# Patient Record
Sex: Female | Born: 1995 | Race: White | Hispanic: No | Marital: Married | Smoking: Former smoker
Health system: Southern US, Community
[De-identification: ages and names within clinical notes are randomized; demographics above are authoritative.]

## PROBLEM LIST (undated history)

## (undated) ENCOUNTER — Inpatient Hospital Stay (HOSPITAL_COMMUNITY): Payer: Self-pay

## (undated) DIAGNOSIS — Z789 Other specified health status: Secondary | ICD-10-CM

## (undated) HISTORY — PX: NO PAST SURGERIES: SHX2092

---

## 2018-07-22 ENCOUNTER — Inpatient Hospital Stay (HOSPITAL_COMMUNITY): Payer: Managed Care, Other (non HMO)

## 2018-07-22 ENCOUNTER — Encounter (HOSPITAL_COMMUNITY): Payer: Self-pay

## 2018-07-22 ENCOUNTER — Inpatient Hospital Stay (HOSPITAL_COMMUNITY)
Admission: AD | Admit: 2018-07-22 | Discharge: 2018-07-22 | Disposition: A | Discharge status: Home / Self Care | Payer: Managed Care, Other (non HMO) | Source: Ambulatory Visit | Attending: Obstetrics and Gynecology | Admitting: Obstetrics and Gynecology

## 2018-07-22 DIAGNOSIS — O23591 Infection of other part of genital tract in pregnancy, first trimester: Secondary | ICD-10-CM | POA: Insufficient documentation

## 2018-07-22 DIAGNOSIS — N760 Acute vaginitis: Secondary | ICD-10-CM | POA: Diagnosis not present

## 2018-07-22 DIAGNOSIS — O209 Hemorrhage in early pregnancy, unspecified: Secondary | ICD-10-CM | POA: Insufficient documentation

## 2018-07-22 DIAGNOSIS — Z87891 Personal history of nicotine dependence: Secondary | ICD-10-CM | POA: Insufficient documentation

## 2018-07-22 DIAGNOSIS — O26899 Other specified pregnancy related conditions, unspecified trimester: Secondary | ICD-10-CM

## 2018-07-22 DIAGNOSIS — R109 Unspecified abdominal pain: Secondary | ICD-10-CM

## 2018-07-22 DIAGNOSIS — Z3A10 10 weeks gestation of pregnancy: Secondary | ICD-10-CM | POA: Diagnosis not present

## 2018-07-22 DIAGNOSIS — R350 Frequency of micturition: Secondary | ICD-10-CM | POA: Diagnosis present

## 2018-07-22 DIAGNOSIS — B9689 Other specified bacterial agents as the cause of diseases classified elsewhere: Secondary | ICD-10-CM | POA: Insufficient documentation

## 2018-07-22 DIAGNOSIS — O26891 Other specified pregnancy related conditions, first trimester: Secondary | ICD-10-CM

## 2018-07-22 HISTORY — DX: Other specified health status: Z78.9

## 2018-07-22 LAB — URINALYSIS, ROUTINE W REFLEX MICROSCOPIC
BILIRUBIN URINE: NEGATIVE
Glucose, UA: NEGATIVE mg/dL
KETONES UR: NEGATIVE mg/dL
Nitrite: NEGATIVE
PROTEIN: 30 mg/dL — AB
SPECIFIC GRAVITY, URINE: 1.018 (ref 1.005–1.030)
pH: 6 (ref 5.0–8.0)

## 2018-07-22 LAB — WET PREP, GENITAL
Sperm: NONE SEEN
Trich, Wet Prep: NONE SEEN
YEAST WET PREP: NONE SEEN

## 2018-07-22 LAB — CBC
HEMATOCRIT: 36.1 % (ref 36.0–46.0)
HEMOGLOBIN: 12.3 g/dL (ref 12.0–15.0)
MCH: 31.3 pg (ref 26.0–34.0)
MCHC: 34.1 g/dL (ref 30.0–36.0)
MCV: 91.9 fL (ref 78.0–100.0)
Platelets: 243 10*3/uL (ref 150–400)
RBC: 3.93 MIL/uL (ref 3.87–5.11)
RDW: 12.6 % (ref 11.5–15.5)
WBC: 11.6 10*3/uL — ABNORMAL HIGH (ref 4.0–10.5)

## 2018-07-22 LAB — HCG, QUANTITATIVE, PREGNANCY: hCG, Beta Chain, Quant, S: 141438 m[IU]/mL — ABNORMAL HIGH (ref <5)

## 2018-07-22 LAB — POCT PREGNANCY, URINE: PREG TEST UR: POSITIVE — AB

## 2018-07-22 MED ORDER — PROMETHAZINE HCL 12.5 MG PO TABS: 12.5000 mg | ORAL_TABLET | Freq: Four times a day (QID) | ORAL | 0 refills | Status: DC | PRN

## 2018-07-22 MED ORDER — METOCLOPRAMIDE HCL 10 MG PO TABS: 10.0000 mg | ORAL_TABLET | Freq: Four times a day (QID) | ORAL | 0 refills | Status: DC

## 2018-07-22 MED ORDER — RANITIDINE HCL 150 MG PO TABS: 150.0000 mg | ORAL_TABLET | Freq: Two times a day (BID) | ORAL | 0 refills | Status: AC

## 2018-07-22 MED ORDER — METRONIDAZOLE 500 MG PO TABS: 500.0000 mg | ORAL_TABLET | Freq: Two times a day (BID) | ORAL | 0 refills | Status: AC

## 2018-07-22 NOTE — MAU Note (Signed)
Pt here with c/o possible UTI, having burn and frequency of urination. Having some vaginal bleeding that started today.

## 2018-07-22 NOTE — Discharge Instructions (Signed)

## 2018-07-22 NOTE — MAU Provider Note (Signed)
History     CSN: [id number]  Arrival date and time: 07/22/18 7829   First Provider Initiated Contact with Patient 07/22/18 2030      Chief Complaint  Patient presents with  . Urinary Tract Infection   HPI  Ms.  Casey Ingram is a 22 y.o. year old G25P0101 female at [redacted]w[redacted]d weeks gestation who presents to MAU reporting possible UTI, burning, and frequency with urination. She also reports some "light pink spotting" that started today. Her last SI was 3-4 days ago. She also complains of nausea; especially with eating. No vomiting. She is requesting a RX for nausea. She has a NOB appt scheduled with [facility] on Friday 07/26/2018. She called their office today and was told to come here for evaluation.  Past Medical History:  Diagnosis Date  . Medical history non-contributory     Past Surgical History:  Procedure Laterality Date  . NO PAST SURGERIES      History reviewed. No pertinent family history.  Social History   Tobacco Use  . Smoking status: Former Smoker    Types: Cigarettes  . Smokeless tobacco: Never Used  Substance Use Topics  . Alcohol use: Not Currently  . Drug use: Never    Allergies: No Known Allergies  No medications prior to admission.    Review of Systems  Constitutional: Negative.   HENT: Negative.   Eyes: Negative.   Respiratory: Negative.   Cardiovascular: Negative.   Gastrointestinal: Positive for abdominal pain and nausea ("especially with eating").  Endocrine: Negative.   Genitourinary: Positive for dysuria, frequency and vaginal bleeding (pink spotting that started today).  Musculoskeletal: Negative.   Skin: Negative.   Allergic/Immunologic: Negative.   Neurological: Negative.   Hematological: Negative.   Psychiatric/Behavioral: Negative.    Physical Exam   Blood pressure 121/67, pulse 69, temperature 98.2 F (36.8 C), temperature source Oral, resp. rate 18, height 5\' 4"  (1.626 m), weight 85.3 kg, last menstrual period 05/17/2018, SpO2 100  %.  Physical Exam  Nursing note and vitals reviewed. Constitutional: She is oriented to person, place, and time. She appears well-developed and well-nourished.  HENT:  Head: Normocephalic and atraumatic.  Eyes: Pupils are equal, round, and reactive to light.  Neck: Normal range of motion.  Cardiovascular: Normal rate and regular rhythm.  Respiratory: Effort normal and breath sounds normal.  GI: Soft. Bowel sounds are normal.  Genitourinary:  Genitourinary Comments: Uterus: enlarged, SE: cervix is smooth, pink, no lesions, small amt of thick, white vaginal d/c; no evidence of spotting or active bleeding at time of exam -- WP, GC/CT done, closed/long/firm, no CMT or friability, no adnexal tenderness    Musculoskeletal: Normal range of motion.  Neurological: She is alert and oriented to person, place, and time.  Skin: Skin is warm and dry.  Psychiatric: She has a normal mood and affect. Her behavior is normal. Judgment and thought content normal.    MAU Course  Procedures  MDM CCUA UPT CBC ABO/Rh HCG Wet Prep GC/CT -- pending HIV -- pending OB < 14 wks [place] with TV  Results for orders placed or performed during the hospital encounter of 07/22/18 (from the past 24 hour(s))  Urinalysis, Routine w reflex microscopic     Status: Abnormal   Collection Time: 07/22/18  8:27 PM  Result Value Ref Range   Color, Urine YELLOW YELLOW   APPearance HAZY (A) CLEAR   Specific Gravity, Urine 1.018 1.005 - 1.030   pH 6.0 5.0 - 8.0   Glucose, UA NEGATIVE  NEGATIVE mg/dL   Hgb urine dipstick LARGE (A) NEGATIVE   Bilirubin Urine NEGATIVE NEGATIVE   Ketones, ur NEGATIVE NEGATIVE mg/dL   Protein, ur 30 (A) NEGATIVE mg/dL   Nitrite NEGATIVE NEGATIVE   Leukocytes, UA SMALL (A) NEGATIVE   RBC / HPF >50 (H) 0 - 5 RBC/hpf   WBC, UA 21-50 0 - 5 WBC/hpf   Bacteria, UA FEW (A) NONE SEEN   Squamous Epithelial / LPF 0-5 0 - 5   Mucus PRESENT   Pregnancy, urine POC     Status: Abnormal   Collection  Time: 07/22/18  8:30 PM  Result Value Ref Range   Preg Test, Ur POSITIVE (A) NEGATIVE  Wet prep, genital     Status: Abnormal   Collection Time: 07/22/18  8:46 PM  Result Value Ref Range   Yeast Wet Prep HPF POC NONE SEEN NONE SEEN   Trich, Wet Prep NONE SEEN NONE SEEN   Clue Cells Wet Prep HPF POC PRESENT (A) NONE SEEN   WBC, Wet Prep HPF POC MODERATE (A) NONE SEEN   Sperm NONE SEEN   CBC     Status: Abnormal   Collection Time: 07/22/18  9:11 PM  Result Value Ref Range   WBC 11.6 (H) 4.0 - 10.5 K/uL   RBC 3.93 3.87 - 5.11 MIL/uL   Hemoglobin 12.3 12.0 - 15.0 g/dL   HCT 09.8 11.9 - 14.7 %   MCV 91.9 78.0 - 100.0 fL   MCH 31.3 26.0 - 34.0 pg   MCHC 34.1 30.0 - 36.0 g/dL   RDW 82.9 56.2 - 13.0 %   Platelets 243 150 - 400 K/uL  ABO/Rh     Status: None (Preliminary result)   Collection Time: 07/22/18  9:11 PM  Result Value Ref Range   ABO/RH(D)      O POS Performed at [facility], [address] [place], [place] [address]   hCG, quantitative, pregnancy     Status: Abnormal   Collection Time: 07/22/18  9:11 PM  Result Value Ref Range   hCG, Beta Chain, Quant, S 141,438 (H) <5 mIU/mL    US Ob Less Than 14 Weeks With Ob Transvaginal  Result Date: 07/22/2018 CLINICAL DATA:  Vaginal bleeding. Positive pregnancy test. Gestational age by last menstrual period 9 weeks and 3 days. EXAM: OBSTETRIC <14 WK [place] AND TRANSVAGINAL OB US TECHNIQUE: Both transabdominal and transvaginal ultrasound examinations were performed for complete evaluation of the gestation as well as the maternal uterus, adnexal regions, and pelvic cul-de-sac. Transvaginal technique was performed to assess early pregnancy. COMPARISON:  None. FINDINGS: Intrauterine gestational sac: Present. Yolk sac:  Present. Embryo:  Present. Cardiac Activity: Present. Heart Rate: 161 bpm CRL:  34 mm   10 w 2 d                  [place] [facility]: April 25th 2020 Subchorionic hemorrhage:  None visualized. Maternal uterus/adnexae: Normal appearance  at adnexa. 19 mm LEFT corpus luteal cyst. No free fluid. IMPRESSION: Single live intrauterine pregnancy, gestational age by ultrasound of 10 weeks and 2 days. No immediate complication. Electronically Signed   By: Awilda Metro M.D.   On: 07/22/2018 22:06    Assessment and Plan  Bacterial vaginitis - Rx for Flagyl 500 mg BID x 7 days - Information provided on BV  Bleeding in early pregnancy  - Advised to call [facility] or return to MAU for bleeding that soaks a pad/hour - Information provided on bleeding in pregnancy  Abdominal pain in pregnancy  - Ok to take Tylenol for pain - Safe meds in pregnancy list given - Information provided on abd pain in pregnancy - Discharge patient - Keep scheduled appt with [facility] on Friday 07/26/2018 - Patient verbalized an understanding of the plan of care and agrees.     Raelyn Mora, MSN, CNM 07/22/2018, 8:30 PM

## 2018-07-23 LAB — ABO/RH: ABO/RH(D): O POS

## 2018-07-23 LAB — GC/CHLAMYDIA PROBE AMP (~~LOC~~) NOT AT ARMC
CHLAMYDIA, DNA PROBE: NEGATIVE
NEISSERIA GONORRHEA: NEGATIVE

## 2018-07-23 LAB — HIV ANTIBODY (ROUTINE TESTING W REFLEX): HIV SCREEN 4TH GENERATION: NONREACTIVE

## 2018-10-23 NOTE — L&D Delivery Note (Signed)
Delivery Note At 3:17 AM a viable female was delivered via Vaginal, Spontaneous (Presentation: ROA).  APGAR: 9, 9; weight pending.  Placenta status: delivered spontaneously and completely.  Cord: 3 vessel with the following complications: nuchal x1, reduced.    Anesthesia:  Epidural  Episiotomy: None Lacerations: 1st degree;Labial Suture Repair: 3.0 vicryl rapide Est. Blood Loss (mL):  Pending   Mom to postpartum.  Baby to Couplet care / Skin to Skin.  Janeece Riggers 02/07/2019, 3:44 AM

## 2019-02-06 ENCOUNTER — Encounter (HOSPITAL_COMMUNITY): Payer: Self-pay

## 2019-02-06 ENCOUNTER — Other Ambulatory Visit: Payer: Self-pay

## 2019-02-06 ENCOUNTER — Inpatient Hospital Stay (HOSPITAL_COMMUNITY)
Admission: AD | Admit: 2019-02-06 | Discharge: 2019-02-08 | DRG: 807 | Disposition: A | Discharge status: Home / Self Care | Payer: Managed Care, Other (non HMO) | Attending: Obstetrics & Gynecology | Admitting: Obstetrics & Gynecology

## 2019-02-06 DIAGNOSIS — Z3A38 38 weeks gestation of pregnancy: Secondary | ICD-10-CM | POA: Diagnosis not present

## 2019-02-06 DIAGNOSIS — B9689 Other specified bacterial agents as the cause of diseases classified elsewhere: Secondary | ICD-10-CM

## 2019-02-06 DIAGNOSIS — Z87891 Personal history of nicotine dependence: Secondary | ICD-10-CM

## 2019-02-06 DIAGNOSIS — O26893 Other specified pregnancy related conditions, third trimester: Secondary | ICD-10-CM | POA: Diagnosis present

## 2019-02-06 DIAGNOSIS — O9902 Anemia complicating childbirth: Secondary | ICD-10-CM | POA: Diagnosis present

## 2019-02-06 DIAGNOSIS — O209 Hemorrhage in early pregnancy, unspecified: Secondary | ICD-10-CM

## 2019-02-06 DIAGNOSIS — D649 Anemia, unspecified: Secondary | ICD-10-CM | POA: Diagnosis present

## 2019-02-06 DIAGNOSIS — N760 Acute vaginitis: Secondary | ICD-10-CM

## 2019-02-06 LAB — CBC
HCT: 32.2 % — ABNORMAL LOW (ref 36.0–46.0)
Hemoglobin: 10.6 g/dL — ABNORMAL LOW (ref 12.0–15.0)
MCH: 29.4 pg (ref 26.0–34.0)
MCHC: 32.9 g/dL (ref 30.0–36.0)
MCV: 89.4 fL (ref 80.0–100.0)
Platelets: 310 10*3/uL (ref 150–400)
RBC: 3.6 MIL/uL — ABNORMAL LOW (ref 3.87–5.11)
RDW: 13.6 % (ref 11.5–15.5)
WBC: 10.9 10*3/uL — ABNORMAL HIGH (ref 4.0–10.5)
nRBC: 0 % (ref 0.0–0.2)

## 2019-02-06 LAB — TYPE AND SCREEN
ABO/RH(D): O POS
Antibody Screen: NEGATIVE

## 2019-02-06 MED ORDER — OXYTOCIN BOLUS FROM INFUSION
500.0000 mL | Freq: Once | INTRAVENOUS | Status: AC
  Administered 2019-02-07: 500 mL via INTRAVENOUS

## 2019-02-06 MED ORDER — LACTATED RINGERS IV SOLN
INTRAVENOUS | Status: DC
  Administered 2019-02-06: via INTRAVENOUS

## 2019-02-06 MED ORDER — ACETAMINOPHEN 325 MG PO TABS: 650.0000 mg | ORAL_TABLET | ORAL | Status: DC | PRN

## 2019-02-06 MED ORDER — OXYCODONE-ACETAMINOPHEN 5-325 MG PO TABS: 2.0000 | ORAL_TABLET | ORAL | Status: DC | PRN

## 2019-02-06 MED ORDER — FENTANYL CITRATE (PF) 100 MCG/2ML IJ SOLN
50.0000 ug | INTRAMUSCULAR | Status: DC | PRN
  Administered 2019-02-07: 50 ug via INTRAVENOUS
  Administered 2019-02-07: 100 ug via INTRAVENOUS
  Filled 2019-02-06 (×2): qty 2

## 2019-02-06 MED ORDER — ONDANSETRON HCL 4 MG/2ML IJ SOLN: 4.0000 mg | Freq: Four times a day (QID) | INTRAMUSCULAR | Status: DC | PRN

## 2019-02-06 MED ORDER — LIDOCAINE HCL (PF) 1 % IJ SOLN
30.0000 mL | INTRAMUSCULAR | Status: AC | PRN
  Administered 2019-02-07: 30 mL via SUBCUTANEOUS
  Filled 2019-02-06: qty 30

## 2019-02-06 MED ORDER — SOD CITRATE-CITRIC ACID 500-334 MG/5ML PO SOLN: 30.0000 mL | ORAL | Status: DC | PRN

## 2019-02-06 MED ORDER — OXYCODONE-ACETAMINOPHEN 5-325 MG PO TABS: 1.0000 | ORAL_TABLET | ORAL | Status: DC | PRN

## 2019-02-06 MED ORDER — LACTATED RINGERS IV SOLN: 500.0000 mL | INTRAVENOUS | Status: DC | PRN

## 2019-02-06 MED ORDER — OXYTOCIN 40 UNITS IN NORMAL SALINE INFUSION - SIMPLE MED
2.5000 [IU]/h | INTRAVENOUS | Status: DC
  Filled 2019-02-06: qty 1000

## 2019-02-06 NOTE — MAU Note (Signed)
Had a gush of fluid at 2015-clear fluid w/ a pink mucous-tinged discharge.  Contracting every 3-6 minutes.  + FM.  Reports no complications w/ the pregnancy.  Was closed on last exam this morning.

## 2019-02-06 NOTE — H&P (Addendum)
Casey Ingram is a 23 y.o. female presenting for SROM in early labor. OB History    Gravida  2   Para  1   Term      Preterm  1   AB      Living  1     SAB      TAB      Ectopic      Multiple      Live Births             Past Medical History:  Diagnosis Date  . Medical history non-contributory    Past Surgical History:  Procedure Laterality Date  . NO PAST SURGERIES     Family History: family history is not on file. Social History:  reports that she has quit smoking. Her smoking use included cigarettes. She has never used smokeless tobacco. She reports previous alcohol use. She reports that she does not use drugs.     Maternal Diabetes: No Genetic Screening: Normal Maternal Ultrasounds/Referrals: Normal Fetal Ultrasounds or other Referrals:  None Maternal Substance Abuse:  No Significant Maternal Medications:  None Significant Maternal Lab Results:  None Other Comments:  None  ROS Maternal Medical History:  Reason for admission: Rupture of membranes and contractions.   Contractions: Onset was less than 1 hour ago.   Frequency: irregular.   Perceived severity is mild.    Fetal activity: Perceived fetal activity is normal.   Last perceived fetal movement was within the past hour.    Prenatal Complications - Diabetes: none.    Dilation: 2 Effacement (%): 70 Station: -2 Exam by:: Latricia Heft, RN   Vitals:   02/06/19 2142 02/06/19 2158 02/06/19 2221  BP:  (!) 134/91 121/67  Pulse:  95 91  Resp:  19   Temp:  98.6 F (37 C)   Weight: 101.7 kg    Height:  5\' 1"  (1.549 m)    Maternal Exam:  Abdomen: Patient reports no abdominal tenderness. Fundal height is Size=dates.   Estimated fetal weight is 7lbs .   Fetal presentation: vertex  Introitus: Normal vulva. Normal vagina.  Vagina is negative for discharge.  Pelvis: adequate for delivery.   Cervix: Cervix evaluated by digital exam.     Physical Exam  Nursing note and vitals  reviewed. Constitutional: She is oriented to person, place, and time. She appears well-developed and well-nourished.  HENT:  Head: Normocephalic and atraumatic.  Eyes: Pupils are equal, round, and reactive to light.  Cardiovascular: Normal rate, regular rhythm and normal heart sounds.  Respiratory: Effort normal and breath sounds normal. No respiratory distress.  GI: There is no abdominal tenderness.  Genitourinary:    Vulva, vagina and uterus normal.     No vaginal discharge.   Musculoskeletal: Normal range of motion.  Neurological: She is alert and oriented to person, place, and time.  Skin: Skin is warm and dry.  Psychiatric: She has a normal mood and affect. Her behavior is normal. Judgment and thought content normal.    Prenatal labs: ABO, Rh: --/--/O POS Performed at [facility], [address] [place], [place] [address]  [phone]) Antibody:  Negative Rubella:  Immune RPR:   NR HBsAg:   NR HIV: Non Reactive (09/30 2111)  GBS:   Negative  Assessment/Plan: 23 y.o. G2P1 at [redacted]w[redacted]d SROM at home in early labor Category 1 FHTs, reactive NST  Admit to L&D Expectant management with augmentation as needed  Ancitipate NSVD   Janeece Riggers 02/06/2019, 11:00 PM

## 2019-02-07 ENCOUNTER — Inpatient Hospital Stay (HOSPITAL_COMMUNITY): Payer: Managed Care, Other (non HMO) | Admitting: Anesthesiology

## 2019-02-07 ENCOUNTER — Encounter (HOSPITAL_COMMUNITY): Payer: Self-pay

## 2019-02-07 LAB — CBC
HCT: 30.3 % — ABNORMAL LOW (ref 36.0–46.0)
Hemoglobin: 9.9 g/dL — ABNORMAL LOW (ref 12.0–15.0)
MCH: 28.9 pg (ref 26.0–34.0)
MCHC: 32.7 g/dL (ref 30.0–36.0)
MCV: 88.6 fL (ref 80.0–100.0)
Platelets: 284 10*3/uL (ref 150–400)
RBC: 3.42 MIL/uL — ABNORMAL LOW (ref 3.87–5.11)
RDW: 13.6 % (ref 11.5–15.5)
WBC: 19.5 10*3/uL — ABNORMAL HIGH (ref 4.0–10.5)
nRBC: 0 % (ref 0.0–0.2)

## 2019-02-07 LAB — RPR: RPR Ser Ql: NONREACTIVE

## 2019-02-07 MED ORDER — EPHEDRINE 5 MG/ML INJ: 10.0000 mg | INTRAVENOUS | Status: DC | PRN

## 2019-02-07 MED ORDER — SIMETHICONE 80 MG PO CHEW: 80.0000 mg | CHEWABLE_TABLET | ORAL | Status: DC | PRN

## 2019-02-07 MED ORDER — PRENATAL MULTIVITAMIN CH
1.0000 | ORAL_TABLET | Freq: Every day | ORAL | Status: DC
  Administered 2019-02-07 – 2019-02-08 (×2): 1 via ORAL
  Filled 2019-02-07 (×2): qty 1

## 2019-02-07 MED ORDER — TETANUS-DIPHTH-ACELL PERTUSSIS 5-2.5-18.5 LF-MCG/0.5 IM SUSP: 0.5000 mL | Freq: Once | INTRAMUSCULAR | Status: DC

## 2019-02-07 MED ORDER — ACETAMINOPHEN 325 MG PO TABS: 650.0000 mg | ORAL_TABLET | ORAL | Status: DC | PRN

## 2019-02-07 MED ORDER — SODIUM CHLORIDE (PF) 0.9 % IJ SOLN
INTRAMUSCULAR | Status: DC | PRN
  Administered 2019-02-07: 12 mL/h via EPIDURAL

## 2019-02-07 MED ORDER — LIDOCAINE HCL (PF) 1 % IJ SOLN
INTRAMUSCULAR | Status: DC | PRN
  Administered 2019-02-07 (×2): 6 mL via EPIDURAL

## 2019-02-07 MED ORDER — PHENYLEPHRINE 40 MCG/ML (10ML) SYRINGE FOR IV PUSH (FOR BLOOD PRESSURE SUPPORT): 80.0000 ug | PREFILLED_SYRINGE | INTRAVENOUS | Status: DC | PRN

## 2019-02-07 MED ORDER — BENZOCAINE-MENTHOL 20-0.5 % EX AERO
1.0000 "application " | INHALATION_SPRAY | CUTANEOUS | Status: DC | PRN
  Administered 2019-02-07: 1 via TOPICAL
  Filled 2019-02-07: qty 56

## 2019-02-07 MED ORDER — IBUPROFEN 600 MG PO TABS
600.0000 mg | ORAL_TABLET | Freq: Four times a day (QID) | ORAL | Status: DC
  Administered 2019-02-07 – 2019-02-08 (×6): 600 mg via ORAL
  Filled 2019-02-07 (×6): qty 1

## 2019-02-07 MED ORDER — ONDANSETRON HCL 4 MG/2ML IJ SOLN: 4.0000 mg | INTRAMUSCULAR | Status: DC | PRN

## 2019-02-07 MED ORDER — FENTANYL-BUPIVACAINE-NACL 0.5-0.125-0.9 MG/250ML-% EP SOLN
12.0000 mL/h | EPIDURAL | Status: DC | PRN
  Filled 2019-02-07: qty 250

## 2019-02-07 MED ORDER — SENNOSIDES-DOCUSATE SODIUM 8.6-50 MG PO TABS
2.0000 | ORAL_TABLET | ORAL | Status: DC
  Administered 2019-02-07: 2 via ORAL
  Filled 2019-02-07: qty 2

## 2019-02-07 MED ORDER — LACTATED RINGERS IV SOLN: 500.0000 mL | Freq: Once | INTRAVENOUS | Status: DC

## 2019-02-07 MED ORDER — DIBUCAINE (PERIANAL) 1 % EX OINT
1.0000 "application " | TOPICAL_OINTMENT | CUTANEOUS | Status: DC | PRN
  Administered 2019-02-07: 1 via RECTAL
  Filled 2019-02-07: qty 28

## 2019-02-07 MED ORDER — DIPHENHYDRAMINE HCL 50 MG/ML IJ SOLN: 12.5000 mg | INTRAMUSCULAR | Status: DC | PRN

## 2019-02-07 MED ORDER — COCONUT OIL OIL
1.0000 "application " | TOPICAL_OIL | Status: DC | PRN
  Administered 2019-02-07: 1 via TOPICAL

## 2019-02-07 MED ORDER — WITCH HAZEL-GLYCERIN EX PADS
1.0000 "application " | MEDICATED_PAD | CUTANEOUS | Status: DC | PRN
  Administered 2019-02-07: 1 via TOPICAL

## 2019-02-07 MED ORDER — ZOLPIDEM TARTRATE 5 MG PO TABS: 5.0000 mg | ORAL_TABLET | Freq: Every evening | ORAL | Status: DC | PRN

## 2019-02-07 MED ORDER — MEDROXYPROGESTERONE ACETATE 150 MG/ML IM SUSP: 150.0000 mg | INTRAMUSCULAR | Status: DC | PRN

## 2019-02-07 MED ORDER — PHENYLEPHRINE 40 MCG/ML (10ML) SYRINGE FOR IV PUSH (FOR BLOOD PRESSURE SUPPORT)
80.0000 ug | PREFILLED_SYRINGE | INTRAVENOUS | Status: DC | PRN
  Filled 2019-02-07: qty 10

## 2019-02-07 MED ORDER — ONDANSETRON HCL 4 MG PO TABS: 4.0000 mg | ORAL_TABLET | ORAL | Status: DC | PRN

## 2019-02-07 MED ORDER — DIPHENHYDRAMINE HCL 25 MG PO CAPS: 25.0000 mg | ORAL_CAPSULE | Freq: Four times a day (QID) | ORAL | Status: DC | PRN

## 2019-02-07 NOTE — Lactation Note (Signed)
This note was copied from a baby's chart. Lactation Consultation Note  Patient Name: Casey Ingram IEPPI'R Date: 02/07/2019 Reason for consult: Initial assessment;Early term 37-38.6wks P2, 19 hour female infant. Infant had one void and 4 stools since delivery. Per mom, she feels breastfeeding is going well, infant is latching without difficulty LC has not observed a latch. Per mom, she last breastfeed at 9:30 pm and infant is asleep in basinet and parents eating dinner. LC ask parents if they want her to come back since they were eating diner they said they were fine to talk.  Mom has order DEBP with insurance company. Per parents, infant has latched six times since delivery for 15 to 30 minutes. Per mom, she did not have this experience with breastfeeding her first child due to infant being in NICU and she pumped only for 5 weeks before stopping due low separation and low milk supply.  Mom knows to breastfeed according to hunger cues , 8 or more times within 24 hours. LC discussed I & O. Reviewed Baby & Me book's Breastfeeding Basics.  Mom knows to call Nurse or LC if she has any questions, concerns or need any assistance with latching infant to breast.  Mom made aware of O/P services, breastfeeding support groups, community resources, and our phone # for post-discharge questions.  Maternal Data Formula Feeding for Exclusion: No Has patient been taught Hand Expression?: Yes Does the patient have breastfeeding experience prior to this delivery?: Yes(Per mom, she mostly pumped with first child for 5 weeks due infant being in NICU.)  Feeding    LATCH Score                   Interventions Interventions: Breast feeding basics reviewed;Hand express;Position options  Lactation Tools Discussed/Used WIC Program: No   Consult Status Consult Status: Follow-up Date: 02/08/19 Follow-up type: In-patient    Danelle Earthly 02/07/2019, 10:51 PM

## 2019-02-07 NOTE — Anesthesia Preprocedure Evaluation (Signed)
Anesthesia Evaluation  Patient identified by MRN, date of birth, ID band Patient awake    Reviewed: Allergy & Precautions, H&P , NPO status , Patient's Chart, lab work & pertinent test results  Airway Mallampati: II  TM Distance: >3 FB Neck ROM: full    Dental no notable dental hx. (+) Teeth Intact   Pulmonary neg pulmonary ROS, former smoker,    Pulmonary exam normal breath sounds clear to auscultation       Cardiovascular negative cardio ROS Normal cardiovascular exam Rhythm:regular Rate:Normal     Neuro/Psych negative neurological ROS  negative psych ROS   GI/Hepatic negative GI ROS, Neg liver ROS,   Endo/Other  Morbid obesity  Renal/GU negative Renal ROS     Musculoskeletal negative musculoskeletal ROS (+)   Abdominal (+) + obese,   Peds  Hematology negative hematology ROS (+)   Anesthesia Other Findings   Reproductive/Obstetrics (+) Pregnancy                             Anesthesia Physical Anesthesia Plan  ASA: III  Anesthesia Plan: Epidural   Post-op Pain Management:    Induction:   PONV Risk Score and Plan:   Airway Management Planned:   Additional Equipment:   Intra-op Plan:   Post-operative Plan:   Informed Consent: I have reviewed the patients History and Physical, chart, labs and discussed the procedure including the risks, benefits and alternatives for the proposed anesthesia with the patient or authorized representative who has indicated his/her understanding and acceptance.       Plan Discussed with:   Anesthesia Plan Comments:         Anesthesia Quick Evaluation

## 2019-02-07 NOTE — Progress Notes (Signed)
Casey Ingram is a 23 y.o. G2P0101 at [redacted]w[redacted]d admitted for rupture of membranes.  Subjective: Patient feeling stronger and more regular contractions.   Objective: Vitals:   02/06/19 2142 02/06/19 2158 02/06/19 2221 02/06/19 2324  BP:  (!) 134/91 121/67 134/89  Pulse:  95 91 83  Resp:  19  20  Temp:  98.6 F (37 C)  97.6 F (36.4 C)  TempSrc:    Oral  Weight: 101.7 kg     Height:  5\' 1"  (1.549 m)     FHT:  FHR: 130s bpm, variability: moderate,  accelerations:  Present,  decelerations:  Present occasional early decels and   UC:   irregular, every 3-6 minutes SVE:   Dilation: 3.5 Effacement (%): 80 Station: -2 Exam by:: CNM, E Greer  Labs: Lab Results  Component Value Date   WBC 10.9 (H) 02/06/2019   HGB 10.6 (L) 02/06/2019   HCT 32.2 (L) 02/06/2019   MCV 89.4 02/06/2019   PLT 310 02/06/2019    Assessment / Plan: Spontaneous labor, progressing normally  Labor: Progressing normally Preeclampsia:  no signs or symptoms of toxicity, intake and ouput balanced and labs stable Fetal Wellbeing:  Category I Pain Control:  IV pain meds I/D:  n/a Anticipated MOD:  NSVD  Casey Ingram 02/07/2019, 1:01 AM

## 2019-02-07 NOTE — Anesthesia Postprocedure Evaluation (Signed)
Anesthesia Post Note  Patient: Casey Ingram  Procedure(s) Performed: AN AD HOC LABOR EPIDURAL     Patient location during evaluation: Mother Baby Anesthesia Type: Epidural Level of consciousness: awake and alert Pain management: pain level controlled Vital Signs Assessment: post-procedure vital signs reviewed and stable Respiratory status: spontaneous breathing, nonlabored ventilation and respiratory function stable Cardiovascular status: stable Postop Assessment: no headache, no backache and epidural receding Anesthetic complications: no Comments: Per telephone conversation    Last Vitals:  Vitals:   02/07/19 1030 02/07/19 1424  BP: 124/83 130/83  Pulse: 67 72  Resp: 18 18  Temp: 36.7 C 37 C  SpO2:  99%    Last Pain:  Vitals:   02/07/19 1424  TempSrc: Oral  PainSc:    Pain Goal:                   Trellis Paganini

## 2019-02-07 NOTE — Plan of Care (Signed)
  Problem: Education: Goal: Knowledge of Childbirth will improve Outcome: Progressing Goal: Ability to make informed decisions regarding treatment and plan of care will improve Outcome: Progressing Goal: Ability to state and carry out methods to decrease the pain will improve Outcome: Progressing   Problem: Pain Management: Goal: Relief or control of pain from uterine contractions will improve Outcome: Progressing

## 2019-02-07 NOTE — Anesthesia Procedure Notes (Signed)
Epidural Patient location during procedure: OB Start time: 02/07/2019 2:48 AM End time: 02/07/2019 2:51 AM  Staffing Anesthesiologist: Leilani Able, MD Performed: anesthesiologist   Preanesthetic Checklist Completed: patient identified, site marked, surgical consent, pre-op evaluation, timeout performed, IV checked, risks and benefits discussed and monitors and equipment checked  Epidural Patient position: sitting Prep: site prepped and draped and DuraPrep Patient monitoring: continuous pulse ox and blood pressure Approach: midline Location: L3-L4 Injection technique: LOR air  Needle:  Needle type: Tuohy  Needle gauge: 17 G Needle length: 9 cm and 9 Needle insertion depth: 7 cm Catheter type: closed end flexible Catheter size: 19 Gauge Catheter at skin depth: 12 cm Test dose: negative and Other  Assessment Sensory level: T9 Events: blood not aspirated, injection not painful, no injection resistance, negative IV test and no paresthesia  Additional Notes Reason for block:procedure for pain

## 2019-02-08 LAB — ABO/RH: ABO/RH(D): O POS

## 2019-02-08 MED ORDER — IBUPROFEN 600 MG PO TABS: 600.0000 mg | ORAL_TABLET | Freq: Four times a day (QID) | ORAL | 0 refills | Status: AC

## 2019-02-08 NOTE — Discharge Instructions (Signed)
Postpartum Care After Vaginal Delivery ° °The period of time right after you deliver your newborn is called the postpartum period. °What kind of medical care will I receive? °· You may continue to receive fluids and medicines through an IV tube inserted into one of your veins. °· If an incision was made near your vagina (episiotomy) or if you had some vaginal tearing during delivery, cold compresses may be placed on your episiotomy or your tear. This helps to reduce pain and swelling. °· You may be given a squirt bottle to use when you go to the bathroom. You may use this until you are comfortable wiping as usual. To use the squirt bottle, follow these steps: °? Before you urinate, fill the squirt bottle with warm water. Do not use hot water. °? After you urinate, while you are sitting on the toilet, use the squirt bottle to rinse the area around your urethra and vaginal opening. This rinses away any urine and blood. °? You may do this instead of wiping. As you start healing, you may use the squirt bottle before wiping yourself. Make sure to wipe gently. °? Fill the squirt bottle with clean water every time you use the bathroom. °· You will be given sanitary pads to wear. °How can I expect to feel? °· You may not feel the need to urinate for several hours after delivery. °· You will have some soreness and pain in your abdomen and vagina. °· If you are breastfeeding, you may have uterine contractions every time you breastfeed for up to several weeks postpartum. Uterine contractions help your uterus return to its normal size. °· It is normal to have vaginal bleeding (lochia) after delivery. The amount and appearance of lochia is often similar to a menstrual period in the first week after delivery. It will gradually decrease over the next few weeks to a dry, yellow-brown discharge. For most women, lochia stops completely by 6-8 weeks after delivery. Vaginal bleeding can vary from woman to woman. °· Within the first few  days after delivery, you may have breast engorgement. This is when your breasts feel heavy, full, and uncomfortable. Your breasts may also throb and feel hard, tightly stretched, warm, and tender. After this occurs, you may have milk leaking from your breasts. Your health care provider can help you relieve discomfort due to breast engorgement. Breast engorgement should go away within a few days. °· You may feel more sad or worried than normal due to hormonal changes after delivery. These feelings should not last more than a few days. If these feelings do not go away after several days, speak with your health care provider. °How should I care for myself? °· Tell your health care provider if you have pain or discomfort. °· Drink enough water to keep your urine clear or pale yellow. °· Wash your hands thoroughly with soap and water for at least 20 seconds after changing your sanitary pads, after using the toilet, and before holding or feeding your baby. °· If you are not breastfeeding, avoid touching your breasts a lot. Doing this can make your breasts produce more milk. °· If you become weak or lightheaded, or you feel like you might faint, ask for help before: °? Getting out of bed. °? Showering. °· Change your sanitary pads frequently. Watch for any changes in your flow, such as a sudden increase in volume, a change in color, the passing of large blood clots. If you pass a blood clot from your vagina,   save it to show to your health care provider. Do not flush blood clots down the toilet without having your health care provider look at them.  Make sure that all your vaccinations are up to date. This can help protect you and your baby from getting certain diseases. You may need to have immunizations done before you leave the hospital.  If desired, talk with your health care provider about methods of family planning or birth control (contraception). How can I start bonding with my baby? Spending as much time as  possible with your baby is very important. During this time, you and your baby can get to know each other and develop a bond. Having your baby stay with you in your room (rooming in) can give you time to get to know your baby. Rooming in can also help you become comfortable caring for your baby. Breastfeeding can also help you bond with your baby. How can I plan for returning home with my baby?  Make sure that you have a car seat installed in your vehicle. ? Your car seat should be checked by a certified car seat installer to make sure that it is installed safely. ? Make sure that your baby fits into the car seat safely.  Ask your health care provider any questions you have about caring for yourself or your baby. Make sure that you are able to contact your health care provider with any questions after leaving the hospital. This information is not intended to replace advice given to you by your health care provider. Make sure you discuss any questions you have with your health care provider. Document Released: 08/06/2007 Document Revised: 03/13/2016 Document Reviewed: 09/13/2015 Elsevier Interactive Patient Education  2018 [employer].   Postpartum Depression and Baby Blues The postpartum period begins right after the birth of a baby. During this time, there is often a great amount of joy and excitement. It is also a time of many changes in the life of the parents. Regardless of how many times a mother gives birth, each child brings new challenges and dynamics to the family. It is not unusual to have feelings of excitement along with confusing shifts in moods, emotions, and thoughts. All mothers are at risk of developing postpartum depression or the "baby blues." These mood changes can occur right after giving birth, or they may occur many months after giving birth. The baby blues or postpartum depression can be mild or severe. Additionally, postpartum depression can go away rather quickly, or it can  be a long-term condition. What are the causes? Raised hormone levels and the rapid drop in those levels are thought to be a main cause of postpartum depression and the baby blues. A number of hormones change during and after pregnancy. Estrogen and progesterone usually decrease right after the delivery of your baby. The levels of thyroid hormone and various cortisol steroids also rapidly drop. Other factors that play a role in these mood changes include major life events and genetics. What increases the risk? If you have any of the following risks for the baby blues or postpartum depression, know what symptoms to watch out for during the postpartum period. Risk factors that may increase the likelihood of getting the baby blues or postpartum depression include:  Having a personal or family history of depression.  Having depression while being pregnant.  Having premenstrual mood issues or mood issues related to oral contraceptives.  Having a lot of life stress.  Having marital conflict.  Lacking  a social support network.  Having a baby with special needs.  Having health problems, such as diabetes.  What are the signs or symptoms? Symptoms of baby blues include:  Brief changes in mood, such as going from extreme happiness to sadness.  Decreased concentration.  Difficulty sleeping.  Crying spells, tearfulness.  Irritability.  Anxiety.  Symptoms of postpartum depression typically begin within the first month after giving birth. These symptoms include:  Difficulty sleeping or excessive sleepiness.  Marked weight loss.  Agitation.  Feelings of worthlessness.  Lack of interest in activity or food.  Postpartum psychosis is a very serious condition and can be dangerous. Fortunately, it is rare. Displaying any of the following symptoms is cause for immediate medical attention. Symptoms of postpartum psychosis include:  Hallucinations and delusions.  Bizarre or disorganized  behavior.  Confusion or disorientation.  How is this diagnosed? A diagnosis is made by an evaluation of your symptoms. There are no medical or lab tests that lead to a diagnosis, but there are various questionnaires that a health care provider may use to identify those with the baby blues, postpartum depression, or psychosis. Often, a screening tool called the [place] Postnatal Depression Scale is used to diagnose depression in the postpartum period. How is this treated? The baby blues usually goes away on its own in 1-2 weeks. Social support is often all that is needed. You will be encouraged to get adequate sleep and rest. Occasionally, you may be given medicines to help you sleep. Postpartum depression requires treatment because it can last several months or longer if it is not treated. Treatment may include individual or group therapy, medicine, or both to address any social, physiological, and psychological factors that may play a role in the depression. Regular exercise, a healthy diet, rest, and social support may also be strongly recommended. Postpartum psychosis is more serious and needs treatment right away. Hospitalization is often needed. Follow these instructions at home:  Get as much rest as you can. Nap when the baby sleeps.  Exercise regularly. Some women find yoga and walking to be beneficial.  Eat a balanced and nourishing diet.  Do little things that you enjoy. Have a cup of tea, take a bubble bath, read your favorite magazine, or listen to your favorite music.  Avoid alcohol.  Ask for help with household chores, cooking, grocery shopping, or running errands as needed. Do not try to do everything.  Talk to people close to you about how you are feeling. Get support from your partner, family members, friends, or other new moms.  Try to stay positive in how you think. Think about the things you are grateful for.  Do not spend a lot of time alone.  Only take  over-the-counter or prescription medicine as directed by your health care provider.  Keep all your postpartum appointments.  Let your health care provider know if you have any concerns. Contact a health care provider if: You are having a reaction to or problems with your medicine. Get help right away if:  You have suicidal feelings.  You think you may harm the baby or someone else. This information is not intended to replace advice given to you by your health care provider. Make sure you discuss any questions you have with your health care provider. Document Released: 07/13/2004 Document Revised: 03/16/2016 Document Reviewed: 07/21/2013 Elsevier Interactive Patient Education  2017 [employer]   Iron-Rich Diet  Iron is a mineral that helps your body to produce hemoglobin. Hemoglobin  is a protein in red blood cells that carries oxygen to your body's tissues. Eating too little iron may cause you to feel weak and tired, and it can increase your risk of infection. Iron is naturally found in many foods, and many foods have iron added to them (iron-fortified foods). You may need to follow an iron-rich diet if you do not have enough iron in your body due to certain medical conditions. The amount of iron that you need each day depends on your age, your sex, and any medical conditions you have. Follow instructions from your health care provider or a diet and nutrition specialist (dietitian) about how much iron you should eat each day. What are tips for following this plan? Reading food labels  Check food labels to see how many milligrams (mg) of iron are in each serving. Cooking  Cook foods in pots and pans that are made from iron.  Take these steps to make it easier for your body to absorb iron from certain foods: ? Soak beans overnight before cooking. ? Soak whole grains overnight and drain them before using. ? Ferment flours before baking, such as by using yeast in bread dough. Meal  planning  When you eat foods that contain iron, you should eat them with foods that are high in vitamin C. These include oranges, peppers, tomatoes, potatoes, and mango. Vitamin C helps your body to absorb iron. General information  Take iron supplements only as told by your health care provider. An overdose of iron can be life-threatening. If you were prescribed iron supplements, take them with orange juice or a vitamin C supplement.  When you eat iron-fortified foods or take an iron supplement, you should also eat foods that naturally contain iron, such as meat, poultry, and fish. Eating naturally iron-rich foods helps your body to absorb the iron that is added to other foods or contained in a supplement.  Certain foods and drinks prevent your body from absorbing iron properly. Avoid eating these foods in the same meal as iron-rich foods or with iron supplements. These foods include: ? Coffee, black tea, and red wine. ? Milk, dairy products, and foods that are high in calcium. ? Beans and soybeans. ? Whole grains. What foods should I eat? Fruits Prunes. Raisins. Eat fruits high in vitamin C, such as oranges, grapefruits, and strawberries, alongside iron-rich foods. Vegetables Spinach (cooked). Green peas. Broccoli. Fermented vegetables. Eat vegetables high in vitamin C, such as leafy greens, potatoes, bell peppers, and tomatoes, alongside iron-rich foods. Grains Iron-fortified breakfast cereal. Iron-fortified whole-wheat bread. Enriched rice. Sprouted grains. Meats and other proteins Beef liver. Oysters. Beef. Shrimp. [place]. Chicken. Tuna. Sardines. Chickpeas. Nuts. Tofu. Pumpkin seeds. Beverages Tomato juice. Fresh orange juice. Prune juice. Hibiscus tea. Fortified instant breakfast shakes. Sweets and desserts Blackstrap molasses. Seasonings and condiments Tahini. Fermented soy sauce. Other foods Wheat germ. The items listed above may not be a complete list of recommended foods  and beverages. Contact a dietitian for more information. What foods should I avoid? Grains Whole grains. Bran cereal. Bran flour. Oats. Meats and other proteins Soybeans. Products made from soy protein. Black beans. Lentils. Mung beans. Split peas. Dairy Milk. Cream. Cheese. Yogurt. Cottage cheese. Beverages Coffee. Black tea. Red wine. Sweets and desserts Cocoa. Chocolate. Ice cream. Other foods Basil. Oregano. Large amounts of parsley. The items listed above may not be a complete list of foods and beverages to avoid. Contact a dietitian for more information. Summary  Iron is a mineral that helps  your body to produce hemoglobin. Hemoglobin is a protein in red blood cells that carries oxygen to your body's tissues.  Iron is naturally found in many foods, and many foods have iron added to them (iron-fortified foods).  When you eat foods that contain iron, you should eat them with foods that are high in vitamin C. Vitamin C helps your body to absorb iron.  Certain foods and drinks prevent your body from absorbing iron properly, such as whole grains and dairy products. You should avoid eating these foods in the same meal as iron-rich foods or with iron supplements. This information is not intended to replace advice given to you by your health care provider. Make sure you discuss any questions you have with your health care provider. Document Released: 05/23/2005 Document Revised: 09/04/2017 Document Reviewed: 09/04/2017 Elsevier Interactive Patient Education  2019 [employer]

## 2019-02-08 NOTE — Discharge Summary (Signed)
OB Discharge Summary     Patient Name: Casey Ingram DOB: 1996/03/11 MRN: [id number]  Date of admission: 02/06/2019 Delivering MD: Janeece RiggersGREER, Drevion Offord K   Date of discharge: 02/08/2019  Admitting diagnosis: water broke  Intrauterine pregnancy: [id number]     Secondary diagnosis:  Active Problems:   Normal labor      Discharge diagnosis: Term Pregnancy Delivered and Anemia                                                                                                Post partum procedures:n/a  Augmentation: n/a  Complications: None  Hospital course:  Onset of Labor With Vaginal Delivery     23 y.o. yo [id number] at 7249w6d was admitted in Latent Labor on 02/06/2019. Patient had an uncomplicated labor course as follows:  Membrane Rupture Time/Date: 8:15 PM ,02/06/2019   Intrapartum Procedures: Episiotomy: None [1]                                         Lacerations:  1st degree [2];Labial [10]  Patient had a delivery of a Viable infant. 02/07/2019  Information for the patient's newborn:  Bruna Pottereague, Girl Morrie Sheldonshley [[id number]]  Delivery Method: Vag-Spont    Pateint had an uncomplicated postpartum course.  She is ambulating, tolerating a regular diet, passing flatus, and urinating well. Patient is discharged home in stable condition on 02/08/19.   Physical exam  Vitals:   02/07/19 1424 02/07/19 1942 02/07/19 2233 02/08/19 0515  BP: 130/83 121/77 131/83 113/76  Pulse: 72 78 79 75  Resp: 18  18   Temp: 98.6 F (37 C) 99.6 F (37.6 C) 98.4 F (36.9 C) 97.8 F (36.6 C)  TempSrc: Oral Oral Oral Oral  SpO2: 99%   98%  Weight:      Height:       General: alert, cooperative and no distress Lochia: appropriate Uterine Fundus: firm Incision: N/A DVT Evaluation: No evidence of DVT seen on physical exam. Negative Homan's sign. No cords or calf tenderness. No significant calf/ankle edema.  Labs: Lab Results  Component Value Date   WBC 19.5 (H) 02/07/2019   HGB 9.9 (L) 02/07/2019   HCT 30.3  (L) 02/07/2019   MCV 88.6 02/07/2019   PLT 284 02/07/2019   No flowsheet data found.  Discharge instruction: per After Visit Summary and "Baby and Me Booklet".  After visit meds:  Allergies as of 02/08/2019   No Known Allergies     Medication List    STOP taking these medications   metoCLOPramide 10 MG tablet Commonly known as:  REGLAN   promethazine 12.5 MG tablet Commonly known as:  PHENERGAN     TAKE these medications   acetaminophen 500 MG tablet Commonly known as:  TYLENOL Take 1,000 mg by mouth every 6 (six) hours as needed for mild pain.   calcium carbonate 500 MG chewable tablet Commonly known as:  TUMS - dosed in mg elemental calcium Chew 2 tablets by mouth daily.   ibuprofen 600  MG tablet Commonly known as:  ADVIL Take 1 tablet (600 mg total) by mouth every 6 (six) hours.   loratadine 10 MG tablet Commonly known as:  CLARITIN Take 10 mg by mouth daily.   prenatal multivitamin Tabs tablet Take 1 tablet by mouth daily at 12 noon.   ranitidine 150 MG tablet Commonly known as:  ZANTAC Take 1 tablet (150 mg total) by mouth 2 (two) times daily.       Diet: routine diet, iron rich diet for mild asymptomatic anemia  Activity: Advance as tolerated. Pelvic rest for 6 weeks.   Outpatient follow up:6 weeks Follow up Appt:No future appointments. Follow up Visit:No follow-ups on file.  Postpartum contraception: Undecided  Newborn Data: Live born female  Birth Weight: 6 lb 12.8 oz (3084 g) APGAR: 9, 9  Newborn Delivery   Birth date/time:  02/07/2019 03:17:00 Delivery type:  Vaginal, Spontaneous     Baby Feeding: Breast Disposition:home with mother   02/08/2019 Janeece Riggers, CNM

## 2019-09-05 IMAGING — US US OB < 14 WEEKS - US OB TV
1 series · 15 of 28 positions shown · non-contrast
Comparison: None.

CLINICAL DATA: Vaginal bleeding. Positive pregnancy test.
Gestational age by last menstrual period 9 weeks and 3 days.

EXAM:
OBSTETRIC <14 WK US AND TRANSVAGINAL OB US
TECHNIQUE: Both transabdominal and transvaginal ultrasound examinations were
performed for complete evaluation of the gestation as well as the
maternal uterus, adnexal regions, and pelvic cul-de-sac.
Transvaginal technique was performed to assess early pregnancy.

[Series 1: us ob < 14 weeks - us ob tv · 15 of 58 slices shown]
[im 1/58]
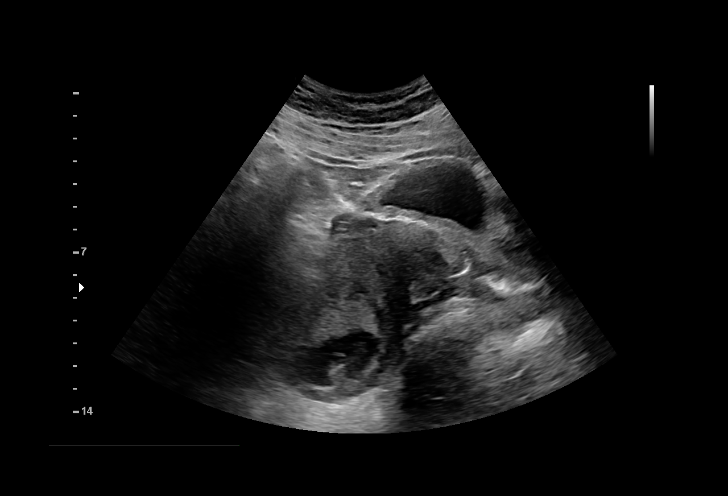
[im 5/58]
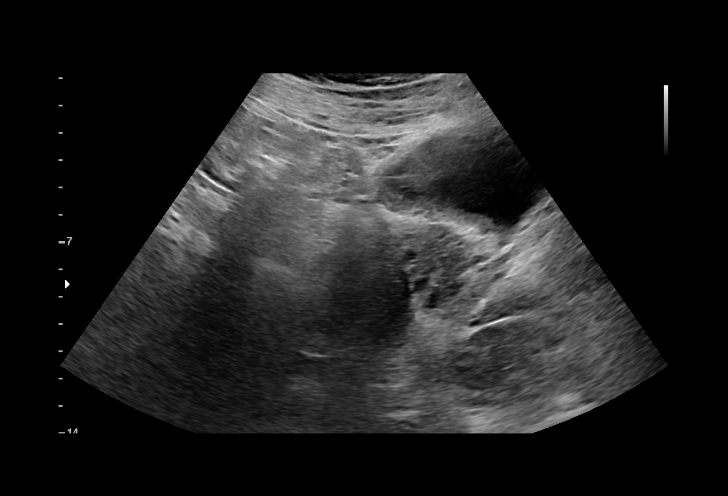
[im 9/58]
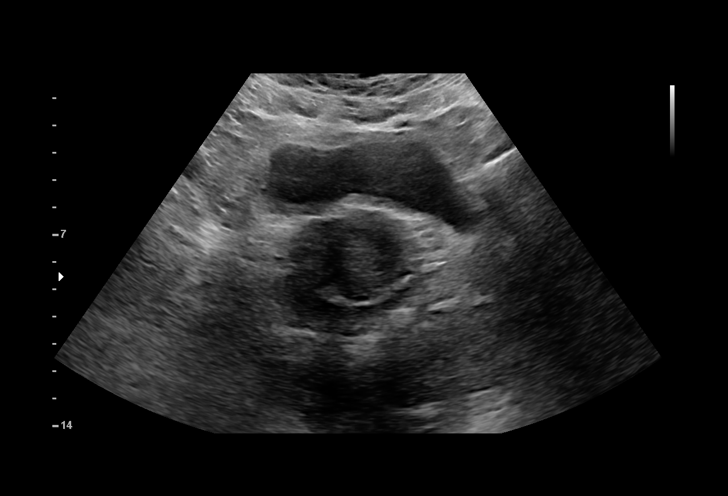
[im 13/58]
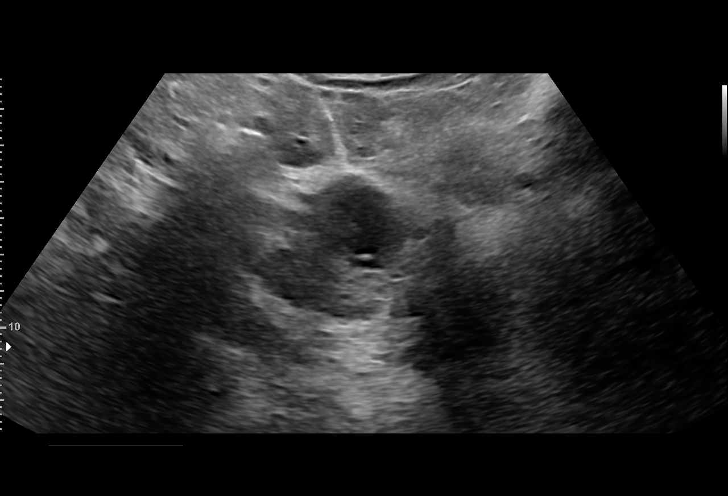
[im 17/58]
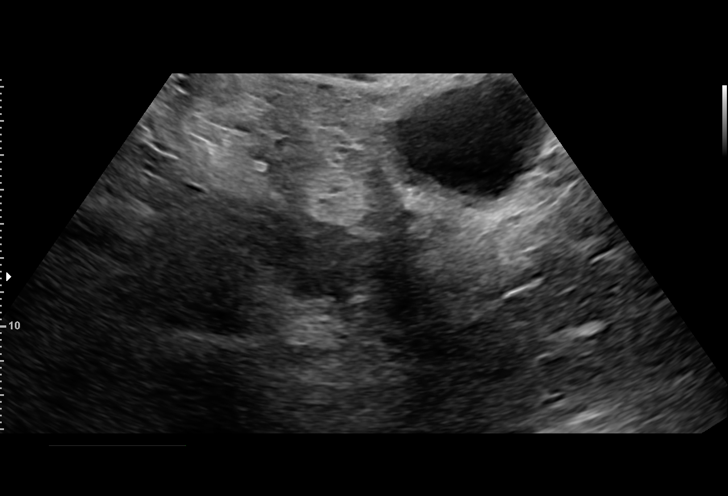
[im 22/58]
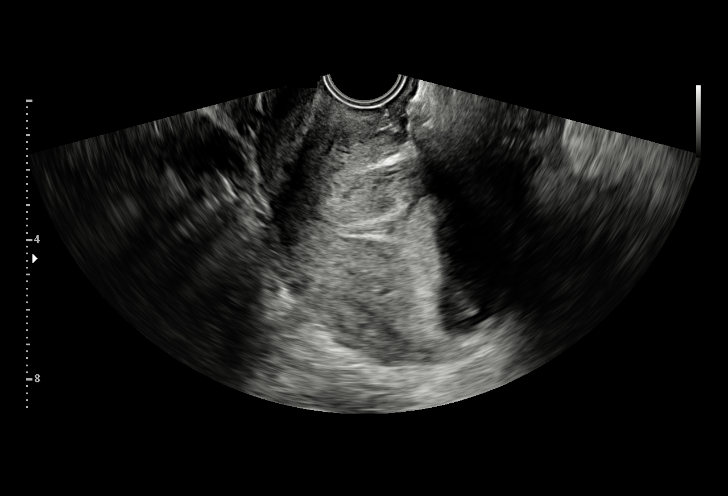
[im 26/58]
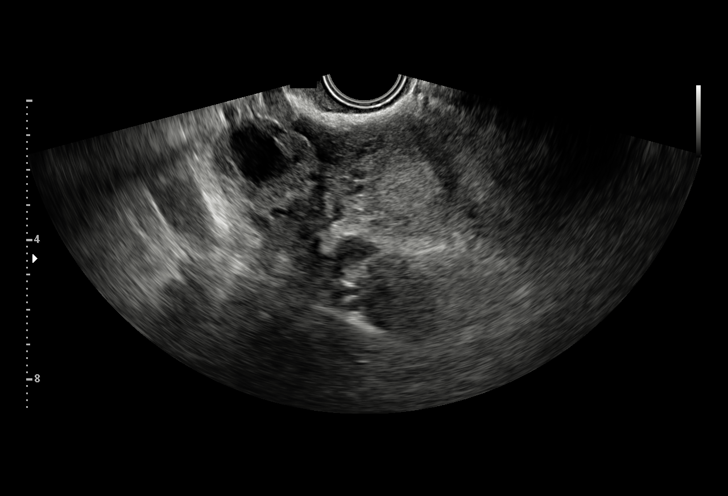
[im 30/58]
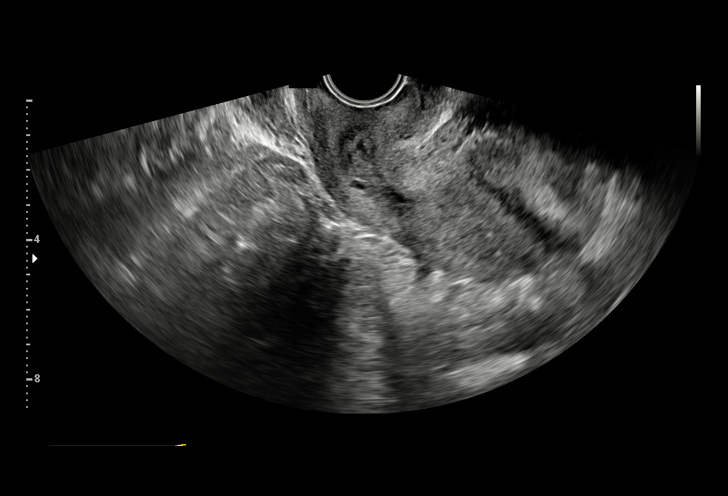
[im 32/58]
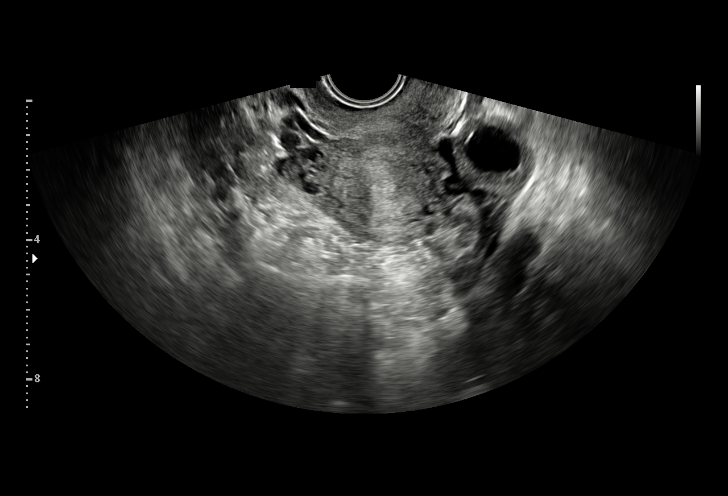
[im 36/58]
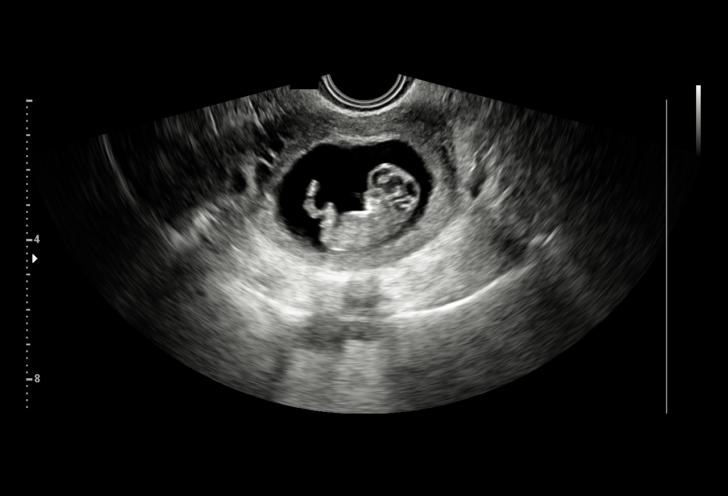
[im 41/58]
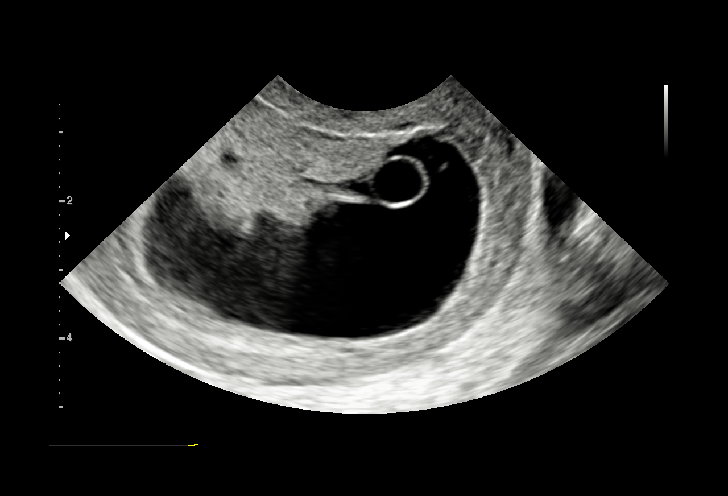
[im 45/58]
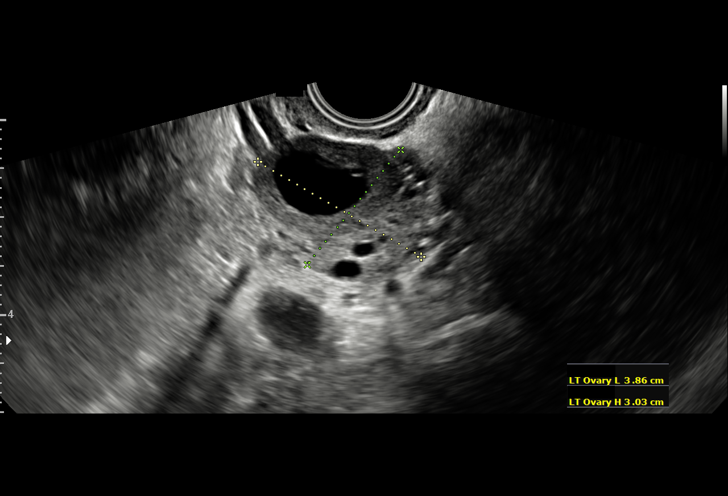
[im 49/58]
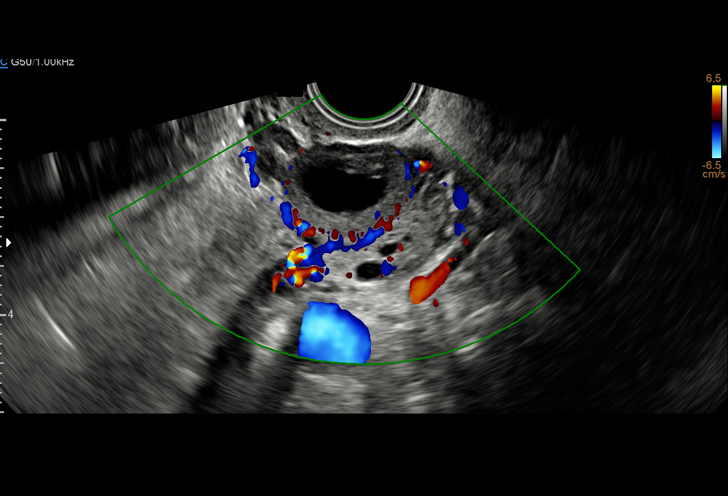
[im 53/58]
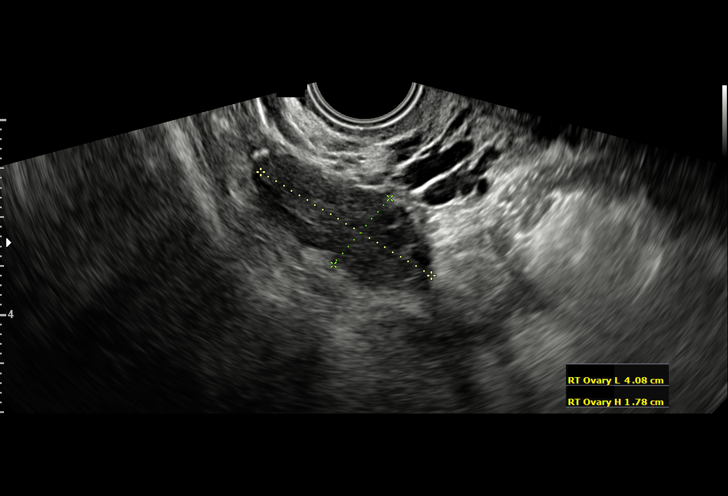
[im 58/58]
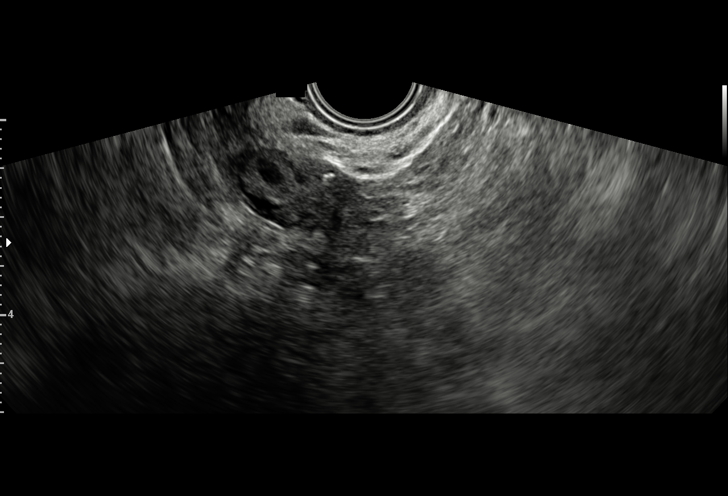

[15 of 28 positions shown; findings below may reference images not displayed]

FINDINGS: Intrauterine gestational sac: Present.

Yolk sac:  Present.

Embryo:  Present.

Cardiac Activity: Present.

Heart Rate: 161 bpm

CRL:  34 mm   10 w 2 d                  US EDC: Friday February, 2019

Subchorionic hemorrhage:  None visualized.

Maternal uterus/adnexae: Normal appearance at adnexa. 19 mm LEFT
corpus luteal cyst. No free fluid.
IMPRESSION: Single live intrauterine pregnancy, gestational age by ultrasound of
10 weeks and 2 days. No immediate complication.

## 2020-07-13 ENCOUNTER — Other Ambulatory Visit: Payer: Self-pay

## 2020-07-13 ENCOUNTER — Ambulatory Visit (HOSPITAL_COMMUNITY)
Admission: EM | Admit: 2020-07-13 | Discharge: 2020-07-13 | Disposition: A | Discharge status: Home / Self Care | Payer: Managed Care, Other (non HMO) | Attending: Family Medicine | Admitting: Family Medicine

## 2020-07-13 ENCOUNTER — Encounter (HOSPITAL_COMMUNITY): Payer: Self-pay | Admitting: Emergency Medicine

## 2020-07-13 DIAGNOSIS — Z3202 Encounter for pregnancy test, result negative: Secondary | ICD-10-CM | POA: Diagnosis not present

## 2020-07-13 DIAGNOSIS — R102 Pelvic and perineal pain: Secondary | ICD-10-CM | POA: Insufficient documentation

## 2020-07-13 DIAGNOSIS — N898 Other specified noninflammatory disorders of vagina: Secondary | ICD-10-CM | POA: Diagnosis not present

## 2020-07-13 LAB — POC URINE PREG, ED: Preg Test, Ur: NEGATIVE

## 2020-07-13 LAB — POCT URINALYSIS DIPSTICK, ED / UC
Bilirubin Urine: NEGATIVE
Glucose, UA: NEGATIVE mg/dL
Hgb urine dipstick: NEGATIVE
Ketones, ur: NEGATIVE mg/dL
Nitrite: NEGATIVE
Protein, ur: NEGATIVE mg/dL
Specific Gravity, Urine: 1.02 (ref 1.005–1.030)
Urobilinogen, UA: 0.2 mg/dL (ref 0.0–1.0)
pH: 7 (ref 5.0–8.0)

## 2020-07-13 MED ORDER — METRONIDAZOLE 500 MG PO TABS: 500.0000 mg | ORAL_TABLET | Freq: Two times a day (BID) | ORAL | 0 refills | Status: AC

## 2020-07-13 MED ORDER — DOXYCYCLINE HYCLATE 100 MG PO CAPS: 100.0000 mg | ORAL_CAPSULE | Freq: Two times a day (BID) | ORAL | 0 refills | Status: AC

## 2020-07-13 MED ORDER — FLUCONAZOLE 150 MG PO TABS: 150.0000 mg | ORAL_TABLET | Freq: Every day | ORAL | 0 refills | Status: AC

## 2020-07-13 NOTE — ED Triage Notes (Signed)
Pt c/o pelvic pain and vaginal discharge x 1 month. Pt states she is urinating frequently and there is an odor. She states she has been drinking a lot of water and cranberry juice and taking AZO tablets.

## 2020-07-13 NOTE — Discharge Instructions (Addendum)
Treating you for bacteria vaginosis and pelvic inflammatory infection. Take the antibiotics as prescribed.  I will also prescribe some fluconazole in case you get a yeast infection from taking antibiotics.  You can have this for standby. Follow-up with your OB/GYN for any continued issues You can check your MyChart for results or we will call you with any positive results

## 2020-07-13 NOTE — ED Provider Notes (Signed)
[facility]    CSN: [id number] Arrival date & time: 07/13/20  3716      History   Chief Complaint Chief Complaint  Patient presents with  . Pelvic Pain  . Vaginal Discharge    HPI Aleiya Rye is a 24 y.o. female.   Patient is a 24 year old female who presents today with approximate 1 month of vaginal discharge, odor.  Symptoms been constant worsening.  She is now having some lower pelvic discomfort.  No fevers, chills or body aches.  She has been taking AZO and drinking cranberry juice.  No dysuria, hematuria or urinary frequency.  Currently sexual active with her husband.  Has IUD for birth control.     Past Medical History:  Diagnosis Date  . Medical history non-contributory     Patient Active Problem List   Diagnosis Date Noted  . Normal labor 02/06/2019  . Bacterial vaginitis 07/22/2018  . Bleeding in early pregnancy 07/22/2018    Past Surgical History:  Procedure Laterality Date  . NO PAST SURGERIES      OB History    Gravida  2   Para  2   Term  1   Preterm  1   AB      Living  2     SAB      TAB      Ectopic      Multiple  0   Live Births  1            Home Medications    Prior to Admission medications   Medication Sig Start Date End Date Taking? Authorizing Provider  acetaminophen (TYLENOL) 500 MG tablet Take 1,000 mg by mouth every 6 (six) hours as needed for mild pain.    [provider]  calcium carbonate (TUMS - DOSED IN MG ELEMENTAL CALCIUM) 500 MG chewable tablet Chew 2 tablets by mouth daily.    [provider]  doxycycline (VIBRAMYCIN) 100 MG capsule Take 1 capsule (100 mg total) by mouth 2 (two) times daily. 07/13/20   Dahlia Byes A, NP  fluconazole (DIFLUCAN) 150 MG tablet Take 1 tablet (150 mg total) by mouth daily. 07/13/20   Dahlia Byes A, NP  ibuprofen (ADVIL) 600 MG tablet Take 1 tablet (600 mg total) by mouth every 6 (six) hours. 02/08/19   Janeece Riggers, CNM  loratadine (CLARITIN) 10  MG tablet Take 10 mg by mouth daily.    [provider]  metroNIDAZOLE (FLAGYL) 500 MG tablet Take 1 tablet (500 mg total) by mouth 2 (two) times daily. 07/13/20   Janace Aris, NP  Prenatal Vit-Fe Fumarate-FA (PRENATAL MULTIVITAMIN) TABS tablet Take 1 tablet by mouth daily at 12 noon.    [provider]  ranitidine (ZANTAC) 150 MG tablet Take 1 tablet (150 mg total) by mouth 2 (two) times daily. Patient not taking: Reported on 02/07/2019 07/22/18   Raelyn Mora, CNM    Family History Family History  Problem Relation Age of Onset  . Healthy Mother   . Healthy Father     Social History Social History   Tobacco Use  . Smoking status: Former Smoker    Types: Cigarettes  . Smokeless tobacco: Never Used  Substance Use Topics  . Alcohol use: Not Currently  . Drug use: Never     Allergies   Patient has no known allergies.   Review of Systems Review of Systems   Physical Exam Triage Vital Signs ED Triage Vitals  Enc Vitals  Group     BP 07/13/20 0944 107/72     Pulse Rate 07/13/20 0944 66     Resp 07/13/20 0944 15     Temp 07/13/20 0944 98.2 F (36.8 C)     Temp Source 07/13/20 0944 Oral     SpO2 07/13/20 0944 99 %     Weight --      Height --      Head Circumference --      Peak Flow --      Pain Score 07/13/20 0853 6     Pain Loc --      Pain Edu? --      Excl. in GC? --    No data found.  Updated Vital Signs BP 107/72 (BP Location: Left Arm)   Pulse 66   Temp 98.2 F (36.8 C) (Oral)   Resp 15   LMP 06/29/2020   SpO2 99%   Visual Acuity Right Eye Distance:   Left Eye Distance:   Bilateral Distance:    Right Eye Near:   Left Eye Near:    Bilateral Near:     Physical Exam Vitals and nursing note reviewed.  Constitutional:      General: She is not in acute distress.    Appearance: Normal appearance. She is not ill-appearing, toxic-appearing or diaphoretic.  HENT:     Head: Normocephalic.     Nose: Nose normal.  Eyes:      Conjunctiva/sclera: Conjunctivae normal.  Pulmonary:     Effort: Pulmonary effort is normal.  Genitourinary:    Comments: External vaginal exam normal without lesions, rash  Internal vaginal exam with thin watery discharge and discharge noted from cervical os with cervical motion tenderness and cervical friability.  IUD strings visible.   Musculoskeletal:        General: Normal range of motion.     Cervical back: Normal range of motion.  Skin:    General: Skin is warm and dry.     Findings: No rash.  Neurological:     Mental Status: She is alert.  Psychiatric:        Mood and Affect: Mood normal.      UC Treatments / Results  Labs (all labs ordered are listed, but only abnormal results are displayed) Labs Reviewed  POCT URINALYSIS DIPSTICK, ED / UC - Abnormal; Notable for the following components:      Result Value   Leukocytes,Ua TRACE (*)    All other components within normal limits  URINE CULTURE  POC URINE PREG, ED  CERVICOVAGINAL ANCILLARY ONLY    EKG   Radiology No results found.  Procedures Procedures (including critical care time)  Medications Ordered in UC Medications - No data to display  Initial Impression / Assessment and Plan / UC Course  I have reviewed the triage vital signs and the nursing notes.  Pertinent labs & imaging results that were available during my care of the patient were reviewed by me and considered in my medical decision making (see chart for details).     Vaginal discharge Patient with cervical motion tenderness and cervical friability on exam with discharge.  Concern for bacterial vaginosis and PID at this time. IUD strings were visible so no concern for migration of IUD  Treat with metronidazole for BV and doxycycline for PID today. Prophylactically covering for yeast based on antibiotic use Swab sent for testing Swab pending.  Urine without infection and pregnancy.  Final Clinical Impressions(s) / UC Diagnoses  Final diagnoses:  Vaginal discharge  Pelvic pain in female     Discharge Instructions     Treating you for bacteria vaginosis and pelvic inflammatory infection. Take the antibiotics as prescribed.  I will also prescribe some fluconazole in case you get a yeast infection from taking antibiotics.  You can have this for standby. Follow-up with your OB/GYN for any continued issues You can check your MyChart for results or we will call you with any positive results    ED Prescriptions    Medication Sig Dispense Auth. Provider   metroNIDAZOLE (FLAGYL) 500 MG tablet Take 1 tablet (500 mg total) by mouth 2 (two) times daily. 14 tablet Aerica Rincon A, NP   doxycycline (VIBRAMYCIN) 100 MG capsule Take 1 capsule (100 mg total) by mouth 2 (two) times daily. 20 capsule Koriana Stepien A, NP   fluconazole (DIFLUCAN) 150 MG tablet Take 1 tablet (150 mg total) by mouth daily. 2 tablet Dahlia Byes A, NP     PDMP not reviewed this encounter.   Dahlia Byes A, NP 07/13/20 1016

## 2020-07-14 LAB — CERVICOVAGINAL ANCILLARY ONLY
Bacterial Vaginitis (gardnerella): POSITIVE — AB
Candida Glabrata: NEGATIVE
Candida Vaginitis: NEGATIVE
Chlamydia: NEGATIVE
Comment: NEGATIVE
Comment: NEGATIVE
Comment: NEGATIVE
Comment: NEGATIVE
Comment: NEGATIVE
Comment: NORMAL
Neisseria Gonorrhea: NEGATIVE
Trichomonas: NEGATIVE

## 2020-07-14 LAB — URINE CULTURE: Culture: NO GROWTH

## 2022-02-02 ENCOUNTER — Emergency Department (HOSPITAL_BASED_OUTPATIENT_CLINIC_OR_DEPARTMENT_OTHER): Payer: 59

## 2022-02-02 ENCOUNTER — Other Ambulatory Visit: Payer: Self-pay

## 2022-02-02 ENCOUNTER — Encounter (HOSPITAL_BASED_OUTPATIENT_CLINIC_OR_DEPARTMENT_OTHER): Payer: Self-pay | Admitting: Emergency Medicine

## 2022-02-02 ENCOUNTER — Emergency Department (HOSPITAL_BASED_OUTPATIENT_CLINIC_OR_DEPARTMENT_OTHER)
Admission: EM | Admit: 2022-02-02 | Discharge: 2022-02-02 | Disposition: A | Discharge status: Home / Self Care | Payer: 59 | Attending: Emergency Medicine | Admitting: Emergency Medicine

## 2022-02-02 DIAGNOSIS — N132 Hydronephrosis with renal and ureteral calculous obstruction: Secondary | ICD-10-CM | POA: Diagnosis not present

## 2022-02-02 DIAGNOSIS — R1031 Right lower quadrant pain: Secondary | ICD-10-CM | POA: Diagnosis present

## 2022-02-02 DIAGNOSIS — N218 Other lower urinary tract calculus: Secondary | ICD-10-CM

## 2022-02-02 LAB — CBC WITH DIFFERENTIAL/PLATELET
Abs Immature Granulocytes: 0 10*3/uL (ref 0.00–0.07)
Basophils Absolute: 0 10*3/uL (ref 0.0–0.1)
Basophils Relative: 1 %
Eosinophils Absolute: 0.1 10*3/uL (ref 0.0–0.5)
Eosinophils Relative: 1 %
HCT: 41.2 % (ref 36.0–46.0)
Hemoglobin: 13.9 g/dL (ref 12.0–15.0)
Immature Granulocytes: 0 %
Lymphocytes Relative: 45 %
Lymphs Abs: 2.4 10*3/uL (ref 0.7–4.0)
MCH: 30.3 pg (ref 26.0–34.0)
MCHC: 33.7 g/dL (ref 30.0–36.0)
MCV: 89.8 fL (ref 80.0–100.0)
Monocytes Absolute: 0.4 10*3/uL (ref 0.1–1.0)
Monocytes Relative: 7 %
Neutro Abs: 2.4 10*3/uL (ref 1.7–7.7)
Neutrophils Relative %: 46 %
Platelets: 312 10*3/uL (ref 150–400)
RBC: 4.59 MIL/uL (ref 3.87–5.11)
RDW: 12.2 % (ref 11.5–15.5)
WBC: 5.2 10*3/uL (ref 4.0–10.5)
nRBC: 0 % (ref 0.0–0.2)

## 2022-02-02 LAB — COMPREHENSIVE METABOLIC PANEL
ALT: 15 U/L (ref 0–44)
AST: 19 U/L (ref 15–41)
Albumin: 4.2 g/dL (ref 3.5–5.0)
Alkaline Phosphatase: 66 U/L (ref 38–126)
Anion gap: 9 (ref 5–15)
BUN: 16 mg/dL (ref 6–20)
CO2: 22 mmol/L (ref 22–32)
Calcium: 9 mg/dL (ref 8.9–10.3)
Chloride: 107 mmol/L (ref 98–111)
Creatinine, Ser: 0.71 mg/dL (ref 0.44–1.00)
GFR, Estimated: >60 mL/min (ref >60)
Glucose, Bld: 87 mg/dL (ref 70–99)
Potassium: 4.3 mmol/L (ref 3.5–5.1)
Sodium: 138 mmol/L (ref 135–145)
Total Bilirubin: 0.8 mg/dL (ref 0.3–1.2)
Total Protein: 7.5 g/dL (ref 6.5–8.1)

## 2022-02-02 LAB — LIPASE, BLOOD: Lipase: 41 U/L (ref 11–51)

## 2022-02-02 LAB — URINALYSIS, ROUTINE W REFLEX MICROSCOPIC
Bilirubin Urine: NEGATIVE
Glucose, UA: NEGATIVE mg/dL
Hgb urine dipstick: NEGATIVE
Ketones, ur: NEGATIVE mg/dL
Leukocytes,Ua: NEGATIVE
Nitrite: NEGATIVE
Protein, ur: NEGATIVE mg/dL
Specific Gravity, Urine: 1.02 (ref 1.005–1.030)
pH: 7 (ref 5.0–8.0)

## 2022-02-02 LAB — PREGNANCY, URINE: Preg Test, Ur: NEGATIVE

## 2022-02-02 MED ORDER — HYDROMORPHONE HCL 1 MG/ML IJ SOLN
0.5000 mg | Freq: Once | INTRAMUSCULAR | Status: AC
  Administered 2022-02-02: 0.5 mg via INTRAVENOUS
  Filled 2022-02-02: qty 1

## 2022-02-02 MED ORDER — MORPHINE SULFATE (PF) 4 MG/ML IV SOLN
4.0000 mg | Freq: Once | INTRAVENOUS | Status: AC
  Administered 2022-02-02: 4 mg via INTRAVENOUS
  Filled 2022-02-02: qty 1

## 2022-02-02 MED ORDER — IOHEXOL 300 MG/ML  SOLN
100.0000 mL | Freq: Once | INTRAMUSCULAR | Status: AC | PRN
  Administered 2022-02-02: 100 mL via INTRAVENOUS

## 2022-02-02 MED ORDER — ONDANSETRON HCL 4 MG/2ML IJ SOLN
4.0000 mg | Freq: Once | INTRAMUSCULAR | Status: AC
  Administered 2022-02-02: 4 mg via INTRAVENOUS
  Filled 2022-02-02: qty 2

## 2022-02-02 NOTE — ED Triage Notes (Signed)
Pt here from home with c/o lower right abd pain that started this morning around 8 am , with some nausea  ?

## 2022-02-02 NOTE — ED Provider Notes (Signed)
?[facility] EMERGENCY DEPARTMENT ?Provider Note ? ? ?CSN: [id number] ?Arrival date & time: 02/02/22  1044 ? ?  ? ?History ?PMH: g2p2 ?Chief Complaint  ?Patient presents with  ? Abdominal Pain  ? ? ?Casey Ingram is a 26 y.o. female. ?Presents the emergency department with a chief complaint of right lower quadrant tenderness.  She states that she noticed it starting around 9 AM this morning.  It started in her right lower quadrant and has wraps around to her right lower back.  She says she has had associated nausea.  Nothing is made it better or worse.  She is taken 4 ibuprofen without relief.  She describes it as 10 out of 10 pain.  She also states she is a pretty high pain tolerance as she is given to burst without any epidural or pain medication. ?She denies any fevers or chills, dysuria, hematuria, flank pain, vaginal discharge, abnormal vaginal bleeding.  She does not have menstrual cycles as she is on the Mirena IUD.  She is sexually active with 1 partner. No hx of abdominal surgeries. ? ? ?Abdominal Pain ?Associated symptoms: nausea   ? ?  ? ?Home Medications ?Prior to Admission medications   ?Medication Sig Start Date End Date Taking? Authorizing Provider  ?acetaminophen (TYLENOL) 500 MG tablet Take 1,000 mg by mouth every 6 (six) hours as needed for mild pain.    [provider]  ?calcium carbonate (TUMS - DOSED IN MG ELEMENTAL CALCIUM) 500 MG chewable tablet Chew 2 tablets by mouth daily.    [provider]  ?doxycycline (VIBRAMYCIN) 100 MG capsule Take 1 capsule (100 mg total) by mouth 2 (two) times daily. 07/13/20   Dahlia ByesBast, Traci A, NP  ?fluconazole (DIFLUCAN) 150 MG tablet Take 1 tablet (150 mg total) by mouth daily. 07/13/20   Dahlia ByesBast, Traci A, NP  ?ibuprofen (ADVIL) 600 MG tablet Take 1 tablet (600 mg total) by mouth every 6 (six) hours. 02/08/19   Janeece RiggersGreer, Ellis K, CNM  ?loratadine (CLARITIN) 10 MG tablet Take 10 mg by mouth daily.    [provider]  ?metroNIDAZOLE (FLAGYL)  500 MG tablet Take 1 tablet (500 mg total) by mouth 2 (two) times daily. 07/13/20   Janace ArisBast, Traci A, NP  ?Prenatal Vit-Fe Fumarate-FA (PRENATAL MULTIVITAMIN) TABS tablet Take 1 tablet by mouth daily at 12 noon.    [provider]  ?ranitidine (ZANTAC) 150 MG tablet Take 1 tablet (150 mg total) by mouth 2 (two) times daily. ?Patient not taking: Reported on 02/07/2019 07/22/18   Raelyn Moraawson, Rolitta, CNM  ?   ? ?Allergies    ?Patient has no known allergies.   ? ?Review of Systems   ?Review of Systems  ?Gastrointestinal:  Positive for abdominal pain and nausea.  ?All other systems reviewed and are negative. ? ?Physical Exam ?Updated Vital Signs ?BP 107/75   Pulse 65   Temp 98.2 ?F (36.8 ?C)   Resp 17   SpO2 99%  ?Physical Exam ?Vitals and nursing note reviewed.  ?Constitutional:   ?   General: She is not in acute distress. ?   Appearance: Normal appearance. She is well-developed. She is not ill-appearing, toxic-appearing or diaphoretic.  ?HENT:  ?   Head: Normocephalic and atraumatic.  ?   Nose: No nasal deformity.  ?   Mouth/Throat:  ?   Lips: Pink. No lesions.  ?Eyes:  ?   General: Gaze aligned appropriately. No scleral icterus.    ?   Right eye: No discharge.     ?  Left eye: No discharge.  ?   Conjunctiva/sclera: Conjunctivae normal.  ?   Right eye: Right conjunctiva is not injected. No exudate or hemorrhage. ?   Left eye: Left conjunctiva is not injected. No exudate or hemorrhage. ?Pulmonary:  ?   Effort: Pulmonary effort is normal. No respiratory distress.  ?Abdominal:  ?   Tenderness: There is abdominal tenderness in the right lower quadrant, epigastric area, periumbilical area and suprapubic area. There is no right CVA tenderness, left CVA tenderness, guarding or rebound. Positive signs include McBurney's sign. Negative signs include Murphy's sign and Rovsing's sign.  ?   Comments: Moderate pain most prominent in the right lower quadrant.  ?Skin: ?   General: Skin is warm and dry.  ?Neurological:  ?    Mental Status: She is alert and oriented to person, place, and time.  ?Psychiatric:     ?   Mood and Affect: Mood normal.     ?   Speech: Speech normal.     ?   Behavior: Behavior normal. Behavior is cooperative.  ? ? ?ED Results / Procedures / Treatments   ?Labs ?(all labs ordered are listed, but only abnormal results are displayed) ?Labs Reviewed  ?CBC WITH DIFFERENTIAL/PLATELET  ?COMPREHENSIVE METABOLIC PANEL  ?LIPASE, BLOOD  ?URINALYSIS, ROUTINE W REFLEX MICROSCOPIC  ?PREGNANCY, URINE  ? ? ?EKG ?None ? ?Radiology ?CT Abdomen Pelvis W Contrast ? ?Result Date: 02/02/2022 ?CLINICAL DATA:  Right lower quadrant abdominal pain EXAM: CT ABDOMEN AND PELVIS WITH CONTRAST TECHNIQUE: Multidetector CT imaging of the abdomen and pelvis was performed using the standard protocol following bolus administration of intravenous contrast. RADIATION DOSE REDUCTION: This exam was performed according to the departmental dose-optimization program which includes automated exposure control, adjustment of the mA and/or kV according to patient size and/or use of iterative reconstruction technique. CONTRAST:  OMNIPAQUE IOHEXOL 300 MG/ML  SOLN COMPARISON:  None. FINDINGS: Lower chest: No acute abnormality. Hepatobiliary: No focal liver abnormality is seen. No gallstones, gallbladder wall thickening, or biliary dilatation. Pancreas: Unremarkable. No pancreatic ductal dilatation or surrounding inflammatory changes. Spleen: Normal in size without focal abnormality. Adrenals/Urinary Tract: Bilateral adrenal glands are unremarkable. No hydronephrosis. Mild right hydronephrosis and hydroureter. 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 Calcification is seen in the bladder measuring 4 mm. Stomach/Bowel: Small hiatal hernia, stomach is otherwise unremarkable. Appendix appears normal. No evidence of bowel wall thickening, distention, or inflammatory changes. Vascular/Lymphatic: No significant vascular findings are present. No enlarged  abdominal or pelvic lymph nodes. Reproductive: Uterus and bilateral adnexa are unremarkable. IUD in place. Other: No abdominal wall hernia or abnormality. No abdominopelvic ascites. Musculoskeletal: No acute or significant osseous findings. IMPRESSION: Mild right hydronephrosis and hydroureter with 53mm calcification seen in the bladder, findings are likely due to recently passed stone. Electronically Signed   By: Allegra Lai M.D.   On: 02/02/2022 13:32  ? ?US PELVIC COMPLETE W TRANSVAGINAL AND TORSION R/O ? ?Result Date: 02/02/2022 ?CLINICAL DATA:  Severe right lower quadrant pain EXAM: TRANSABDOMINAL AND TRANSVAGINAL ULTRASOUND OF PELVIS DOPPLER ULTRASOUND OF OVARIES TECHNIQUE: Both transabdominal and transvaginal ultrasound examinations of the pelvis were performed. Transabdominal technique was performed for global imaging of the pelvis including uterus, ovaries, adnexal regions, and pelvic cul-de-sac. It was necessary to proceed with endovaginal exam following the transabdominal exam to visualize the right ovary. Color and duplex Doppler ultrasound was utilized to evaluate blood flow to the ovaries. COMPARISON:  None. FINDINGS: Uterus Measurements: 7.2 x 3.2 x 4.5 cm = volume:  54 mL. No fibroids or other mass visualized. Endometrium Thickness: 18 mm. Linear echogenic focus in the endometrial canal consistent with the presence of an IUD. Right ovary Measurements: 2.4 x 2.0 x 1.9 cm = volume: 4.8 mL. Normal appearance/no adnexal mass. Left ovary Measurements: 2.8 x 1.8 x 1.6 cm = volume: 4.7 mL. Normal appearance/no adnexal mass. Pulsed Doppler evaluation of both ovaries demonstrates normal low-resistance arterial and venous waveforms. Other findings No abnormal free fluid. IMPRESSION: 1. No evidence of ovarian torsion at this time. 2. IUD in place. Electronically Signed   By: Malachy Moan M.D.   On: 02/02/2022 12:26   ? ?Procedures ?Procedures  ? ? ?Medications Ordered in ED ?Medications  ?ondansetron  (ZOFRAN) injection 4 mg (4 mg Intravenous Given 02/02/22 1156)  ?morphine (PF) 4 MG/ML injection 4 mg (4 mg Intravenous Given 02/02/22 1157)  ?HYDROmorphone (DILAUDID) injection 0.5 mg (0.5 mg Intravenous Given 02/02/22

## 2023-03-19 IMAGING — US US PELVIS COMPLETE TRANSABD/TRANSVAG W DUPLEX AND/OR DOPPLER
1 series · 13 of 25 positions shown · non-contrast
Comparison: None.

CLINICAL DATA: Severe right lower quadrant pain

EXAM:
TRANSABDOMINAL AND TRANSVAGINAL ULTRASOUND OF PELVIS
DOPPLER ULTRASOUND OF OVARIES
TECHNIQUE: Both transabdominal and transvaginal ultrasound examinations of the
pelvis were performed. Transabdominal technique was performed for
global imaging of the pelvis including uterus, ovaries, adnexal
regions, and pelvic cul-de-sac.
It was necessary to proceed with endovaginal exam following the
transabdominal exam to visualize the right ovary. Color and duplex
Doppler ultrasound was utilized to evaluate blood flow to the
ovaries.

[Series 1: us pelvis complete transabd/transvag w duplex and/ · 13 of 86 slices shown]
[im 1/86]
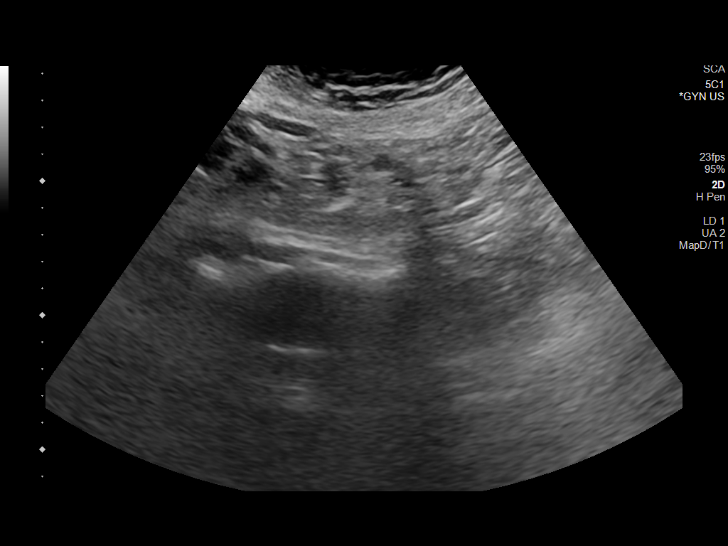
[im 8/86]
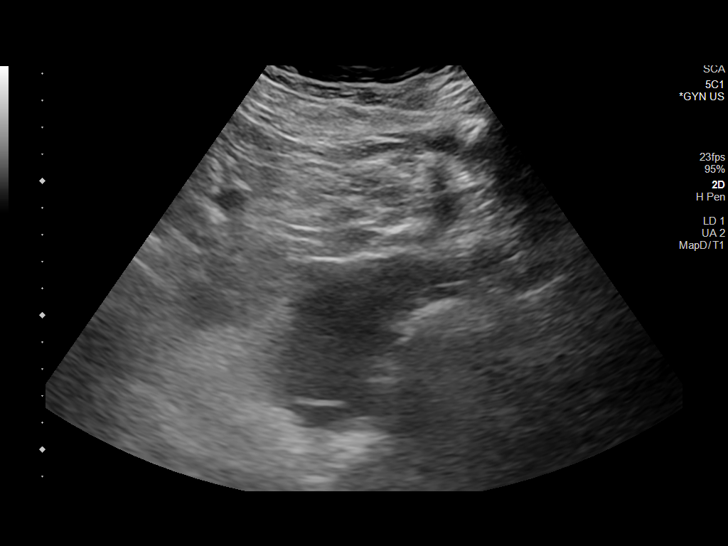
[im 15/86]
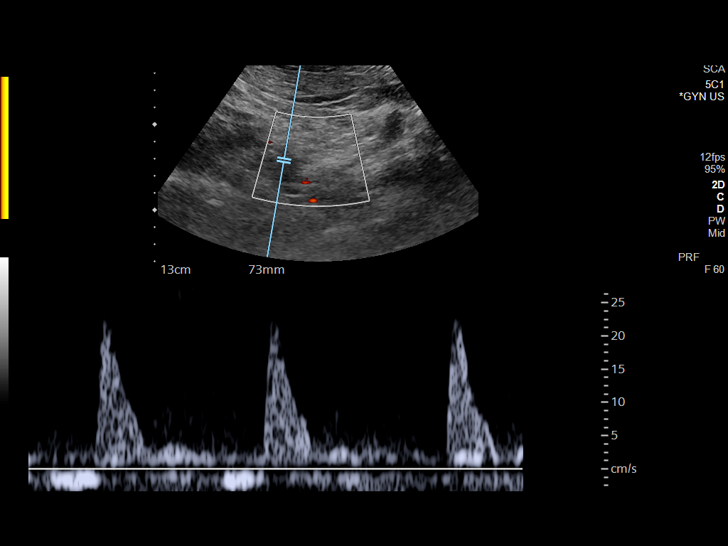
[im 22/86]
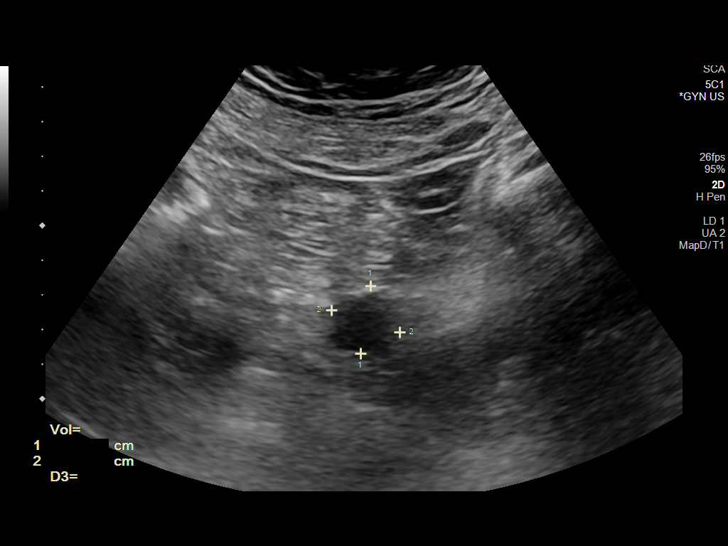
[im 29/86]
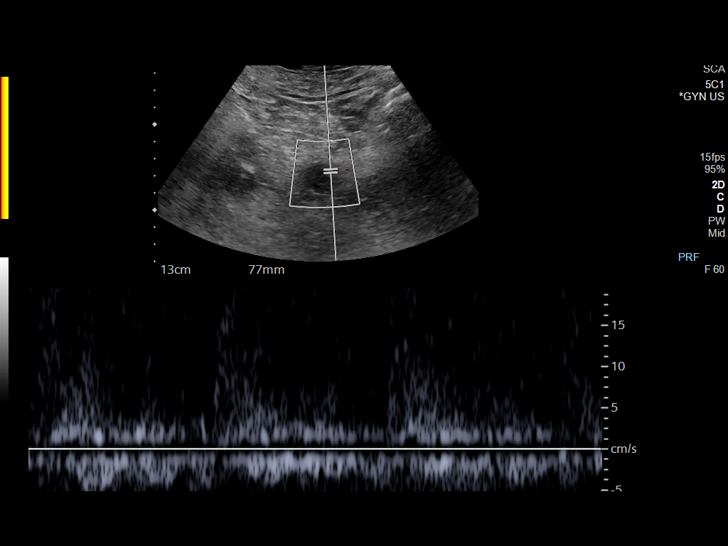
[im 36/86]
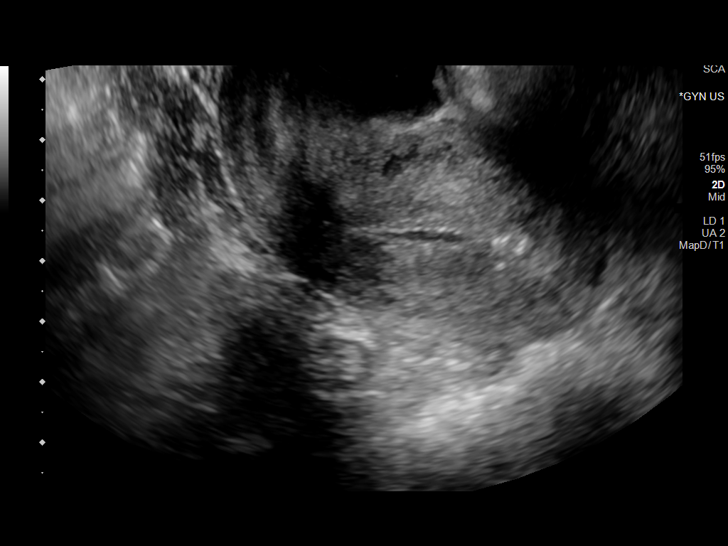
[im 43/86]
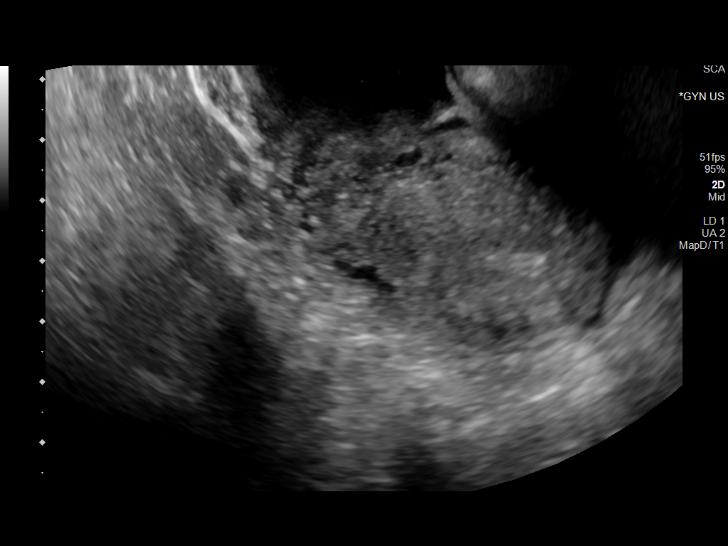
[im 50/86]
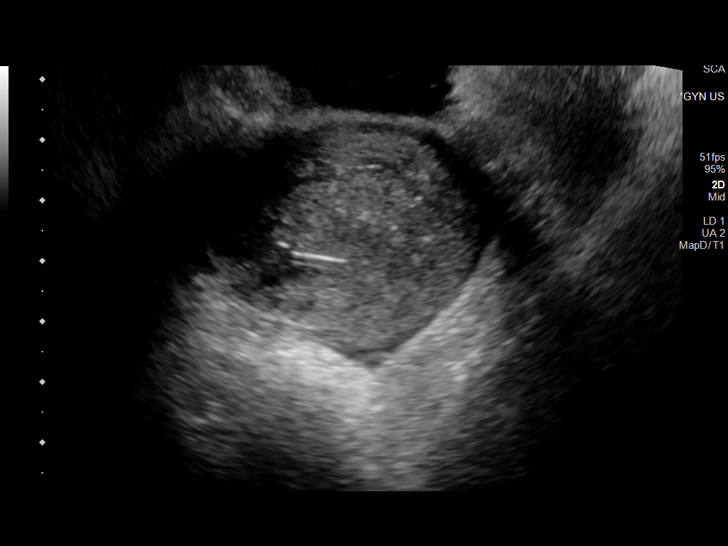
[im 57/86]
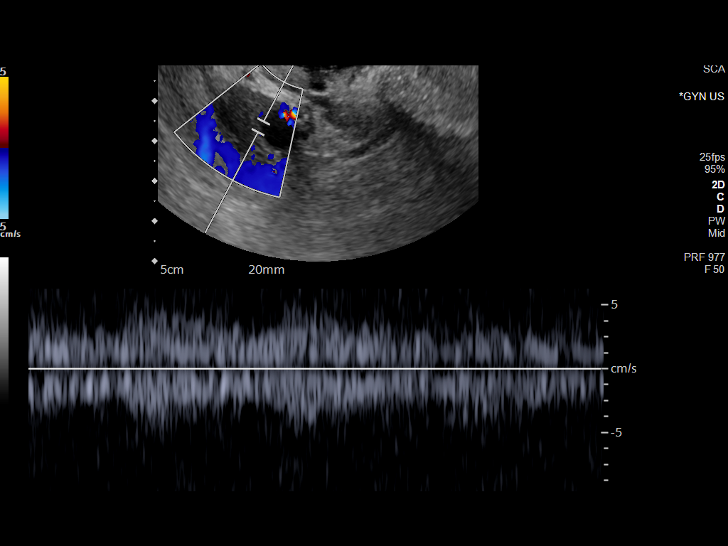
[im 64/86]
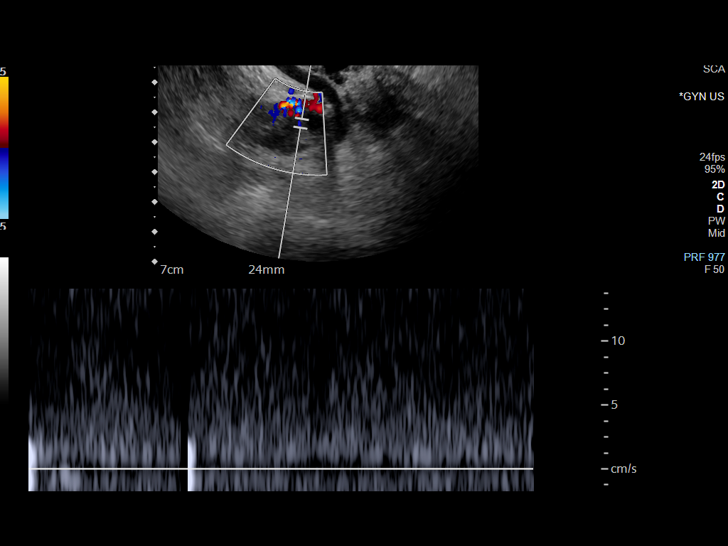
[im 71/86]
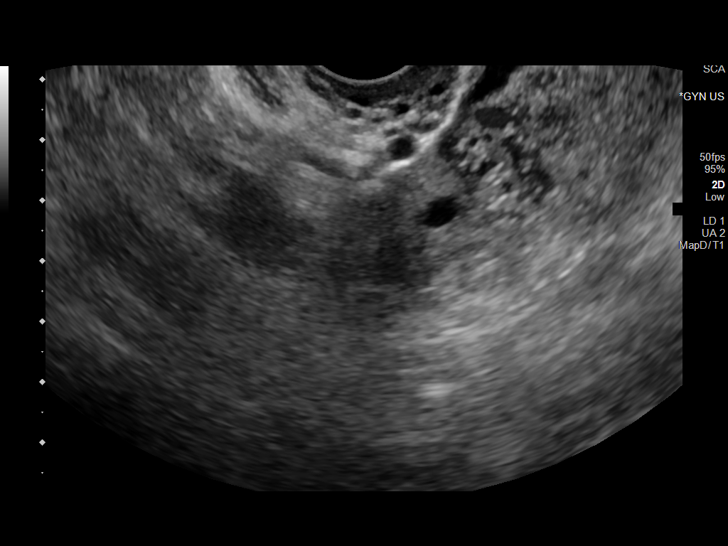
[im 78/86]
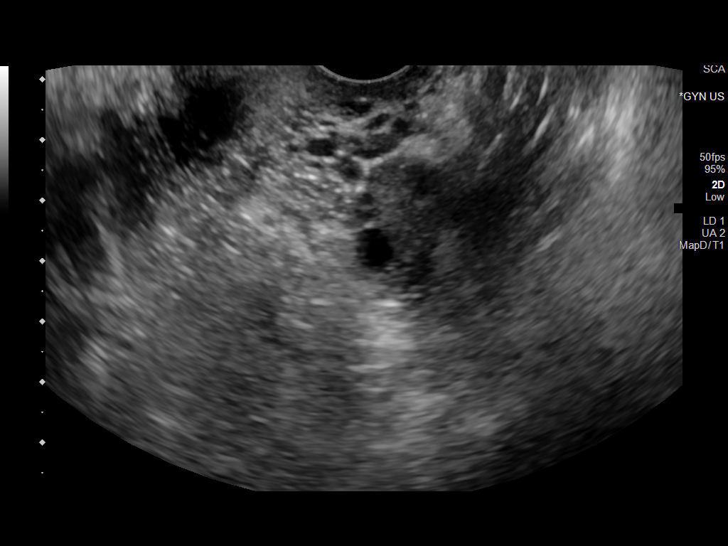
[im 86/86]
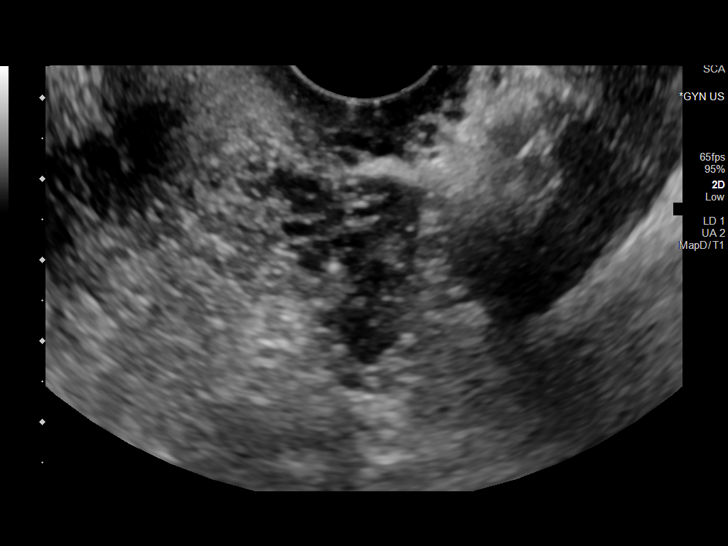

[13 of 25 positions shown; findings below may reference images not displayed]

FINDINGS: Uterus

Measurements: 7.2 x 3.2 x 4.5 cm = volume: 54 mL. No fibroids or
other mass visualized.

Endometrium

Thickness: 18 mm. Linear echogenic focus in the endometrial canal
consistent with the presence of an IUD.

Right ovary

Measurements: 2.4 x 2.0 x 1.9 cm = volume: 4.8 mL. Normal
appearance/no adnexal mass.

Left ovary

Measurements: 2.8 x 1.8 x 1.6 cm = volume: 4.7 mL. Normal
appearance/no adnexal mass.

Pulsed Doppler evaluation of both ovaries demonstrates normal
low-resistance arterial and venous waveforms.

Other findings

No abnormal free fluid.
IMPRESSION: 1. No evidence of ovarian torsion at this time.
2. IUD in place.
